# Patient Record
Sex: Female | Born: 1960
Health system: Southern US, Community
[De-identification: ages and names within clinical notes are randomized; demographics above are authoritative.]

## PROBLEM LIST (undated history)

## (undated) DIAGNOSIS — Z9889 Other specified postprocedural states: Secondary | ICD-10-CM

## (undated) DIAGNOSIS — K449 Diaphragmatic hernia without obstruction or gangrene: Secondary | ICD-10-CM

## (undated) DIAGNOSIS — F32A Depression, unspecified: Secondary | ICD-10-CM

## (undated) DIAGNOSIS — R7401 Elevation of levels of liver transaminase levels: Secondary | ICD-10-CM

## (undated) DIAGNOSIS — R112 Nausea with vomiting, unspecified: Secondary | ICD-10-CM

## (undated) DIAGNOSIS — E039 Hypothyroidism, unspecified: Secondary | ICD-10-CM

## (undated) DIAGNOSIS — F329 Major depressive disorder, single episode, unspecified: Secondary | ICD-10-CM

## (undated) HISTORY — DX: Depression, unspecified: F32.A

## (undated) HISTORY — DX: Elevation of levels of liver transaminase levels: R74.01

## (undated) HISTORY — DX: Diaphragmatic hernia without obstruction or gangrene: K44.9

## (undated) HISTORY — DX: Major depressive disorder, single episode, unspecified: F32.9

## (undated) HISTORY — DX: Hypothyroidism, unspecified: E03.9

---

## 1988-08-24 HISTORY — PX: TUBAL LIGATION: SHX77

## 1994-08-24 HISTORY — PX: CHOLECYSTECTOMY, LAPAROSCOPIC: SHX56

## 2004-01-15 ENCOUNTER — Ambulatory Visit (HOSPITAL_COMMUNITY): Admission: RE | Admit: 2004-01-15 | Discharge: 2004-01-15 | Payer: Self-pay | Admitting: Urology

## 2004-07-22 ENCOUNTER — Ambulatory Visit (HOSPITAL_COMMUNITY): Admission: RE | Admit: 2004-07-22 | Discharge: 2004-07-22 | Payer: Self-pay | Admitting: Obstetrics and Gynecology

## 2006-05-07 ENCOUNTER — Ambulatory Visit (HOSPITAL_COMMUNITY): Admission: RE | Admit: 2006-05-07 | Discharge: 2006-05-07 | Payer: Self-pay | Admitting: Urology

## 2008-11-26 ENCOUNTER — Ambulatory Visit (HOSPITAL_COMMUNITY): Admission: RE | Admit: 2008-11-26 | Discharge: 2008-11-26 | Payer: Self-pay | Admitting: Urology

## 2009-06-22 IMAGING — CT CT PELVIS W/O CM
2 of 4 series · 17 of 46 positions shown, 19 images · non-contrast
Comparison: CT 05/07/2006.

CT ABDOMEN

CLINICAL DATA: Hematuria.  Right lower quadrant pain.

CT ABDOMEN AND PELVIS WITHOUT CONTRAST
TECHNIQUE: Multidetector CT imaging of the abdomen and pelvis was
performed following the standard protocol without intravenous
contrast.

[Series 2: stone <45 and or <(id) 5.0 mm · axial · 0.79mm/px · z∈[-496,-51]mm · 14 of 97 slices shown, 16 images]
[im 4/97  soft-tissue]
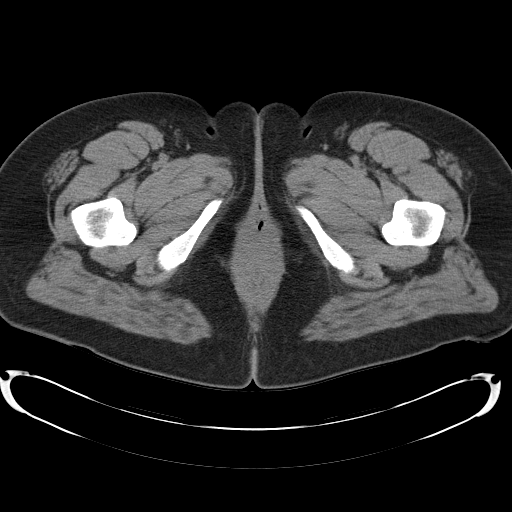
[im 4/97  bone]
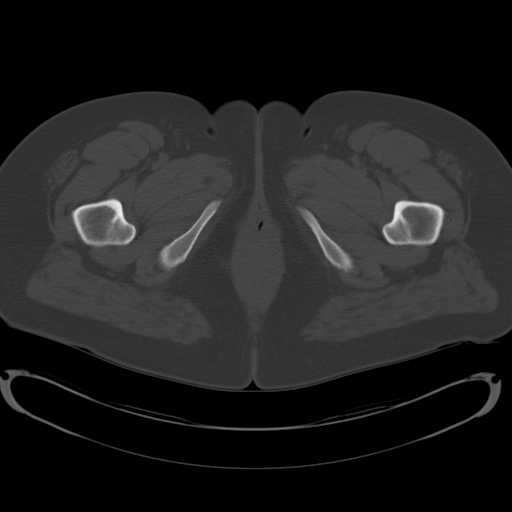
[im 12/97  soft-tissue]
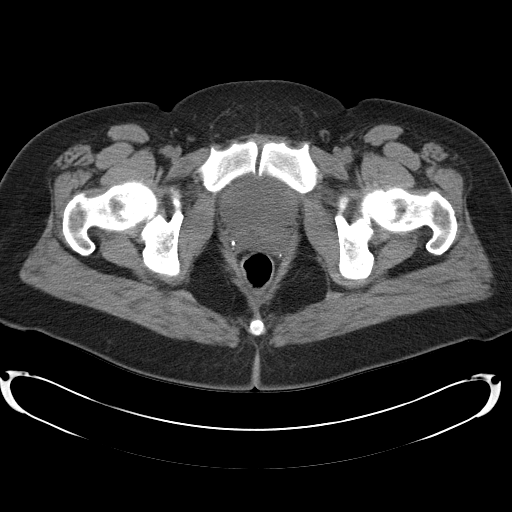
[im 20/97  soft-tissue]
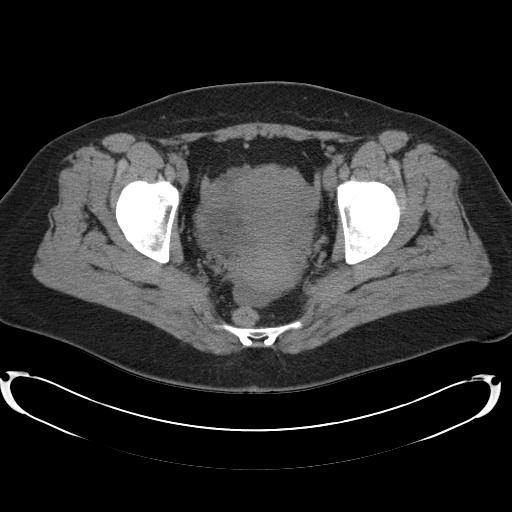
[im 27/97  soft-tissue]
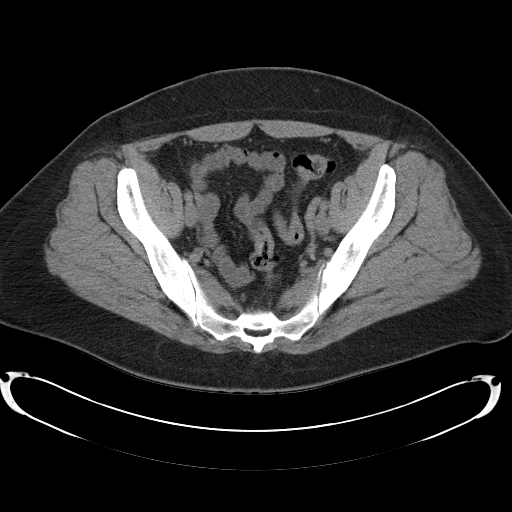
[im 31/97  soft-tissue]
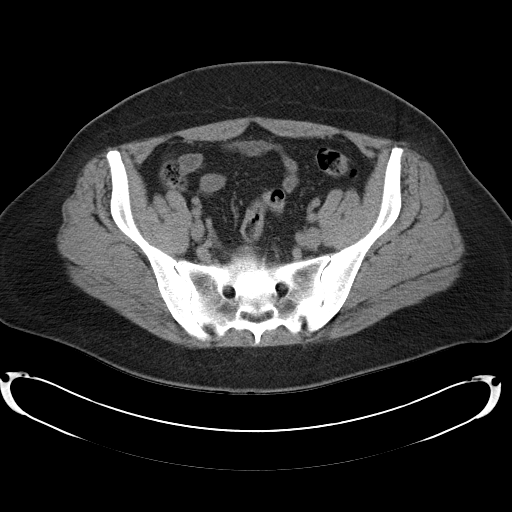
[im 39/97  soft-tissue]
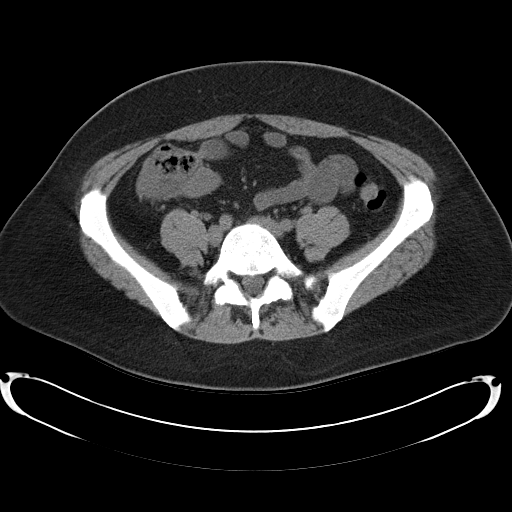
[im 47/97  soft-tissue]
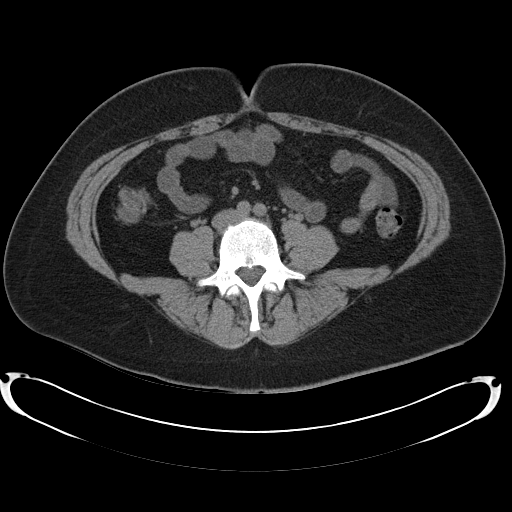
[im 50/97  soft-tissue]
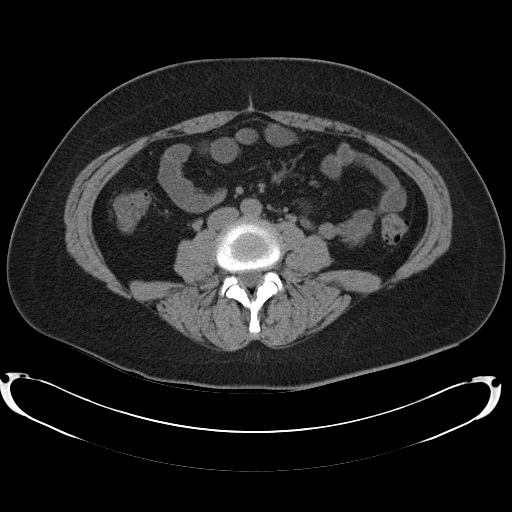
[im 58/97  soft-tissue]
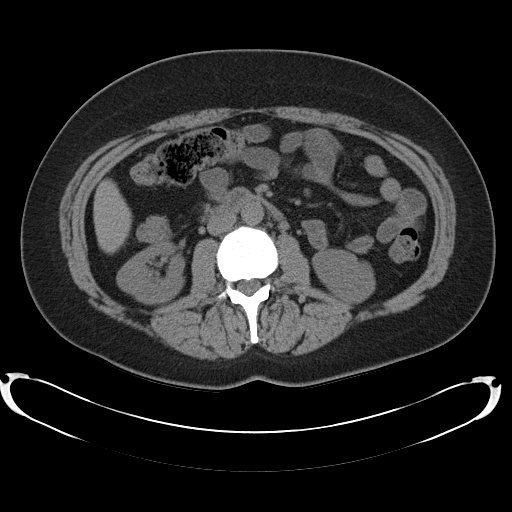
[im 58/97  bone]
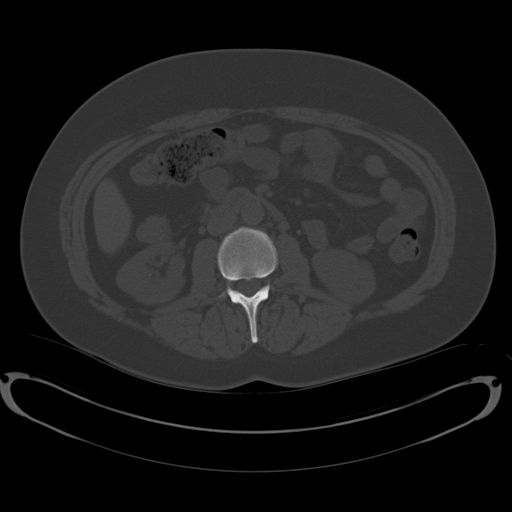
[im 66/97  soft-tissue]
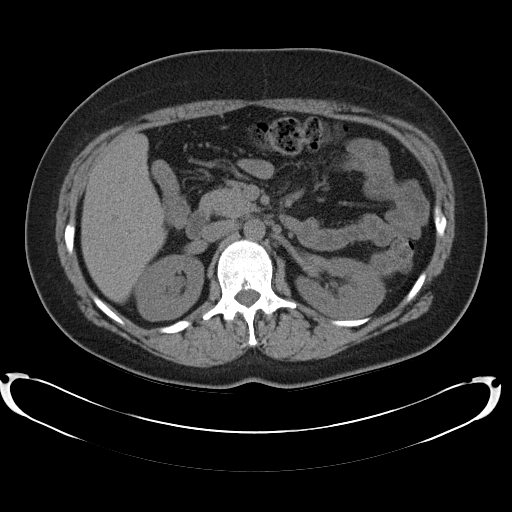
[im 73/97  soft-tissue]
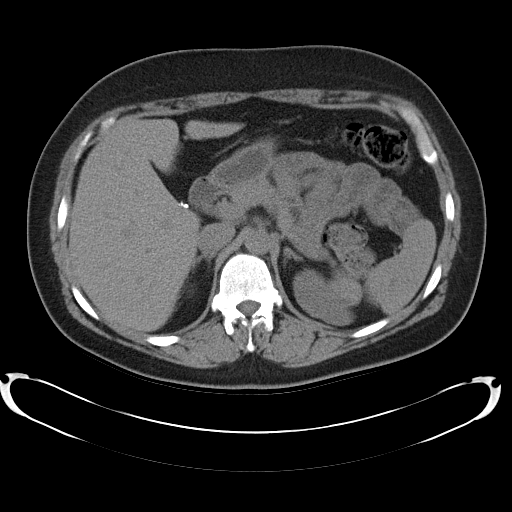
[im 77/97  soft-tissue]
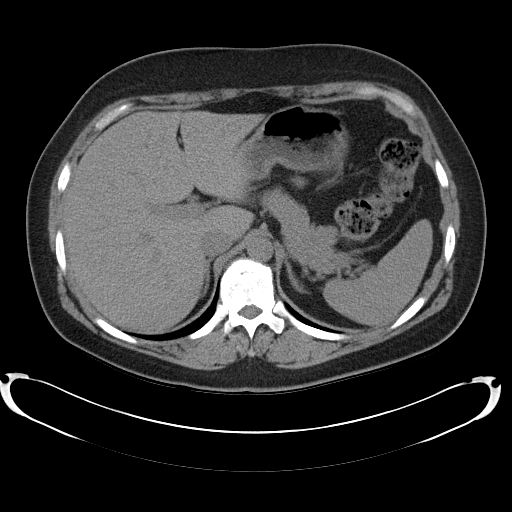
[im 85/97  soft-tissue]
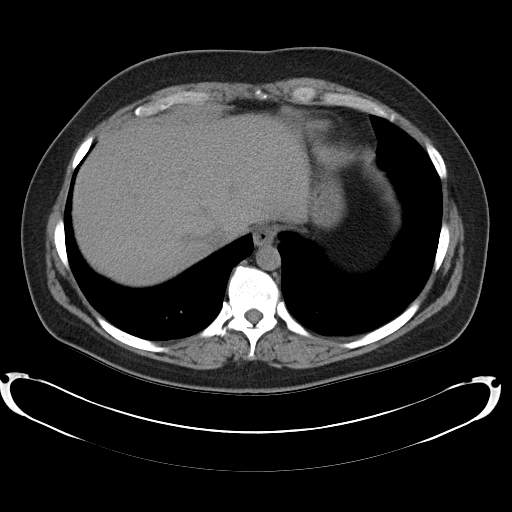
[im 93/97  soft-tissue]
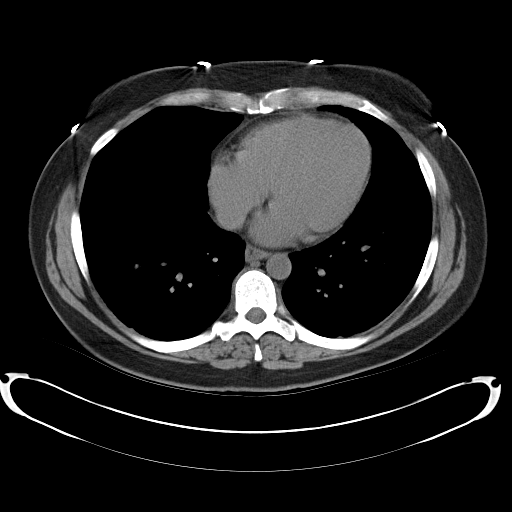

[Series 4: mpr coronal · coronal · 0.76mm/px · 3 of 77 slices shown]
[im 26/77  soft-tissue]
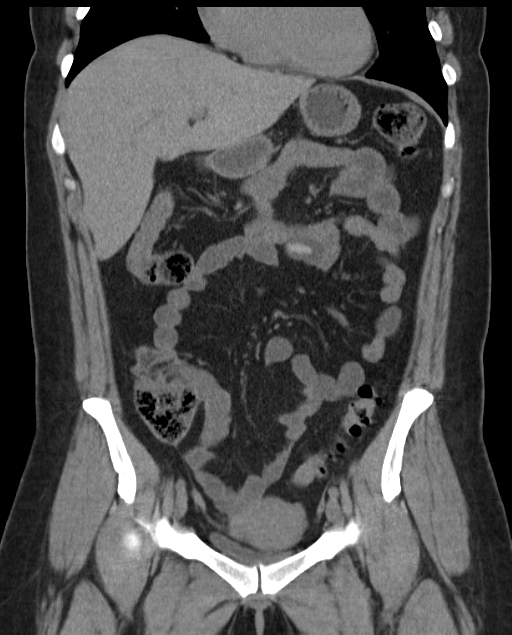
[im 34/77  soft-tissue]
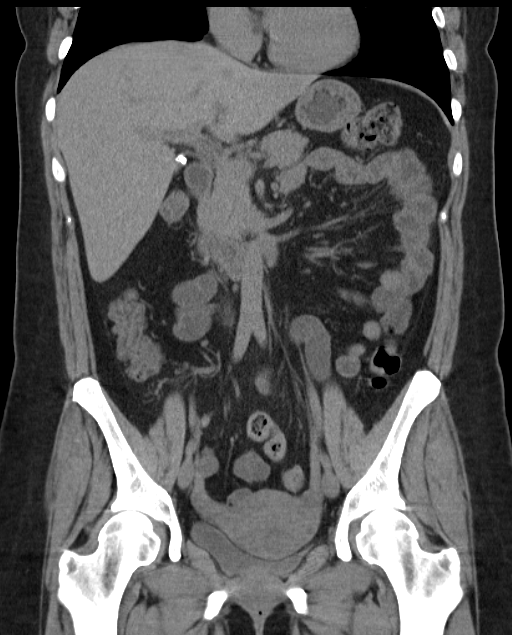
[im 43/77  soft-tissue]
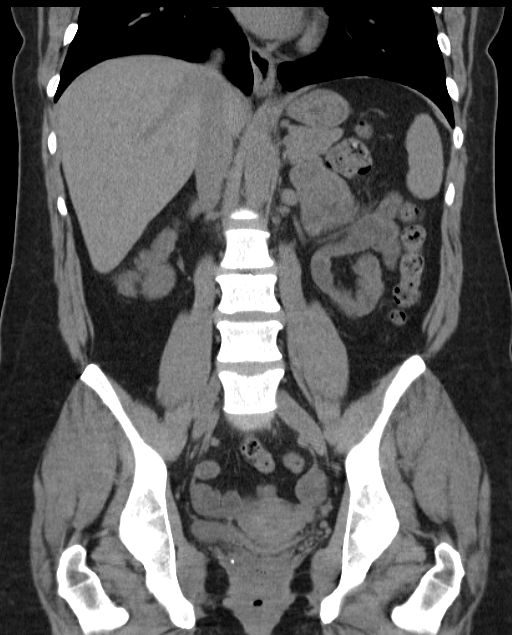

[17 of 46 positions shown; findings below may reference images not displayed]

FINDINGS: Negative for renal calculi.  Mild extrarenal pelvis on
the right is unchanged.  There is no hydronephrosis.  There is no
perinephric stranding.  Small calcification the right adrenal gland
is unchanged.  The adrenal glands are not enlarged.  The
gallbladder has been removed.  The liver spleen pancreas are
normal.  The bowel is normal.
IMPRESSION: No acute abnormality.  Negative for renal calculi.

CT PELVIS
FINDINGS: There are calcifications in the pelvis bilaterally.
These are all most likely phleboliths.  There are two new  small
new calcifications in the right lower pelvis which are felt to be
due to phleboliths as there is no hydronephrosis.  The uterus is
normal and the bowel is normal.  The appendix is normal.
IMPRESSION: No acute abnormality.

## 2010-06-23 ENCOUNTER — Ambulatory Visit (HOSPITAL_COMMUNITY): Admission: RE | Admit: 2010-06-23 | Discharge: 2010-06-23 | Payer: Self-pay | Admitting: Urology

## 2010-09-14 ENCOUNTER — Encounter: Payer: Self-pay | Admitting: Family Medicine

## 2010-09-14 ENCOUNTER — Encounter: Payer: Self-pay | Admitting: Urology

## 2010-09-17 ENCOUNTER — Ambulatory Visit (HOSPITAL_COMMUNITY)
Admission: RE | Admit: 2010-09-17 | Discharge: 2010-09-17 | Payer: Self-pay | Source: Home / Self Care | Attending: Urology | Admitting: Urology

## 2011-01-28 ENCOUNTER — Emergency Department (HOSPITAL_COMMUNITY): Payer: BC Managed Care – PPO

## 2011-01-28 ENCOUNTER — Observation Stay (HOSPITAL_COMMUNITY)
Admission: EM | Admit: 2011-01-28 | Discharge: 2011-01-29 | Disposition: A | Discharge status: Home / Self Care | Payer: BC Managed Care – PPO | Attending: Internal Medicine | Admitting: Internal Medicine

## 2011-01-28 DIAGNOSIS — R0789 Other chest pain: Principal | ICD-10-CM | POA: Insufficient documentation

## 2011-01-28 DIAGNOSIS — E039 Hypothyroidism, unspecified: Secondary | ICD-10-CM | POA: Insufficient documentation

## 2011-01-28 DIAGNOSIS — F411 Generalized anxiety disorder: Secondary | ICD-10-CM | POA: Insufficient documentation

## 2011-01-28 DIAGNOSIS — F3289 Other specified depressive episodes: Secondary | ICD-10-CM | POA: Insufficient documentation

## 2011-01-28 DIAGNOSIS — F329 Major depressive disorder, single episode, unspecified: Secondary | ICD-10-CM | POA: Insufficient documentation

## 2011-01-28 DIAGNOSIS — E663 Overweight: Secondary | ICD-10-CM | POA: Insufficient documentation

## 2011-01-28 LAB — BASIC METABOLIC PANEL
BUN: 20 mg/dL (ref 6–23)
CO2: 25 mEq/L (ref 19–32)
Calcium: 10.2 mg/dL (ref 8.4–10.5)
Chloride: 104 mEq/L (ref 96–112)
Creatinine, Ser: 0.73 mg/dL (ref 0.4–1.2)
GFR calc Af Amer: >60 mL/min (ref >60)
GFR calc non Af Amer: >60 mL/min (ref >60)
Glucose, Bld: 91 mg/dL (ref 70–99)
Potassium: 3.6 mEq/L (ref 3.5–5.1)
Sodium: 138 mEq/L (ref 135–145)

## 2011-01-28 LAB — DIFFERENTIAL
Basophils Absolute: 0 10*3/uL (ref 0.0–0.1)
Basophils Relative: 0 % (ref 0–1)
Eosinophils Absolute: 0.3 10*3/uL (ref 0.0–0.7)
Eosinophils Relative: 3 % (ref 0–5)
Lymphocytes Relative: 34 % (ref 12–46)
Lymphs Abs: 3 10*3/uL (ref 0.7–4.0)
Monocytes Absolute: 0.7 10*3/uL (ref 0.1–1.0)
Monocytes Relative: 8 % (ref 3–12)
Neutro Abs: 4.9 10*3/uL (ref 1.7–7.7)
Neutrophils Relative %: 55 % (ref 43–77)

## 2011-01-28 LAB — D-DIMER, QUANTITATIVE: D-Dimer, Quant: 0.46 ug/mL-FEU (ref 0.00–0.48)

## 2011-01-28 LAB — CK TOTAL AND CKMB (NOT AT ARMC)
CK, MB: 1.6 ng/mL (ref 0.3–4.0)
Relative Index: INVALID (ref 0.0–2.5)
Total CK: 81 U/L (ref 7–177)

## 2011-01-28 LAB — HEPATIC FUNCTION PANEL
ALT: 25 U/L (ref 0–35)
AST: 20 U/L (ref 0–37)
Albumin: 3.9 g/dL (ref 3.5–5.2)
Alkaline Phosphatase: 64 U/L (ref 39–117)
Bilirubin, Direct: 0.1 mg/dL (ref 0.0–0.3)
Indirect Bilirubin: 0.6 mg/dL (ref 0.3–0.9)
Total Bilirubin: 0.7 mg/dL (ref 0.3–1.2)
Total Protein: 7.5 g/dL (ref 6.0–8.3)

## 2011-01-28 LAB — CBC
HCT: 39 % (ref 36.0–46.0)
Hemoglobin: 13.4 g/dL (ref 12.0–15.0)
MCH: 30.9 pg (ref 26.0–34.0)
MCHC: 34.4 g/dL (ref 30.0–36.0)
MCV: 89.9 fL (ref 78.0–100.0)
Platelets: 278 10*3/uL (ref 150–400)
RBC: 4.34 MIL/uL (ref 3.87–5.11)
RDW: 12.8 % (ref 11.5–15.5)
WBC: 8.9 10*3/uL (ref 4.0–10.5)

## 2011-01-28 LAB — LIPASE, BLOOD: Lipase: 39 U/L (ref 11–59)

## 2011-01-28 LAB — TROPONIN I: Troponin I: <0.3 ng/mL (ref <0.30)

## 2011-01-29 LAB — CARDIAC PANEL(CRET KIN+CKTOT+MB+TROPI)
CK, MB: 1.5 ng/mL (ref 0.3–4.0)
CK, MB: 1.3 ng/mL (ref 0.3–4.0)
Relative Index: INVALID (ref 0.0–2.5)
Relative Index: INVALID (ref 0.0–2.5)
Total CK: 67 U/L (ref 7–177)
Total CK: 61 U/L (ref 7–177)
Troponin I: <0.3 ng/mL (ref <0.30)
Troponin I: <0.3 ng/mL (ref <0.30)

## 2011-01-29 LAB — COMPREHENSIVE METABOLIC PANEL
ALT: 22 U/L (ref 0–35)
AST: 19 U/L (ref 0–37)
Albumin: 3.7 g/dL (ref 3.5–5.2)
Alkaline Phosphatase: 60 U/L (ref 39–117)
BUN: 16 mg/dL (ref 6–23)
CO2: 27 mEq/L (ref 19–32)
Calcium: 10 mg/dL (ref 8.4–10.5)
Chloride: 103 mEq/L (ref 96–112)
Creatinine, Ser: 0.82 mg/dL (ref 0.4–1.2)
GFR calc Af Amer: >60 mL/min (ref >60)
GFR calc non Af Amer: >60 mL/min (ref >60)
Glucose, Bld: 86 mg/dL (ref 70–99)
Potassium: 4 mEq/L (ref 3.5–5.1)
Sodium: 138 mEq/L (ref 135–145)
Total Bilirubin: 0.9 mg/dL (ref 0.3–1.2)
Total Protein: 7.2 g/dL (ref 6.0–8.3)

## 2011-01-29 LAB — CBC
HCT: 39.3 % (ref 36.0–46.0)
Hemoglobin: 13.1 g/dL (ref 12.0–15.0)
MCH: 30.3 pg (ref 26.0–34.0)
MCHC: 33.3 g/dL (ref 30.0–36.0)
MCV: 91 fL (ref 78.0–100.0)
Platelets: 272 10*3/uL (ref 150–400)
RBC: 4.32 MIL/uL (ref 3.87–5.11)
RDW: 13 % (ref 11.5–15.5)
WBC: 9.6 10*3/uL (ref 4.0–10.5)

## 2011-01-29 LAB — LIPID PANEL
Cholesterol: 173 mg/dL (ref 0–200)
HDL: 40 mg/dL (ref >39)
LDL Cholesterol: 100 mg/dL — ABNORMAL HIGH (ref 0–99)
Total CHOL/HDL Ratio: 4.3 RATIO
Triglycerides: 163 mg/dL — ABNORMAL HIGH (ref <150)
VLDL: 33 mg/dL (ref 0–40)

## 2011-01-29 LAB — DIFFERENTIAL
Basophils Absolute: 0 10*3/uL (ref 0.0–0.1)
Basophils Relative: 0 % (ref 0–1)
Eosinophils Absolute: 0.3 10*3/uL (ref 0.0–0.7)
Eosinophils Relative: 3 % (ref 0–5)
Lymphocytes Relative: 44 % (ref 12–46)
Lymphs Abs: 4.2 10*3/uL — ABNORMAL HIGH (ref 0.7–4.0)
Monocytes Absolute: 0.6 10*3/uL (ref 0.1–1.0)
Monocytes Relative: 6 % (ref 3–12)
Neutro Abs: 4.5 10*3/uL (ref 1.7–7.7)
Neutrophils Relative %: 47 % (ref 43–77)

## 2011-01-29 NOTE — H&P (Signed)
Tanya Fuentes, PEEKS NO.:  [id number]  MEDICAL RECORD NO.:  [id number]  LOCATION:  [facility]                          FACILITY:  [facility]  PHYSICIAN:  Osvaldo Shipper, MD     DATE OF BIRTH:  [birth year]  DATE OF ADMISSION:  01/28/2011 DATE OF DISCHARGE:  [facility]                             HISTORY & PHYSICAL   PRIMARY CARE PHYSICIAN:  [name], [place].  ADMISSION DIAGNOSES: 1. Chest pain, rule out acute coronary syndrome. 2. Overweight. 3. History of depression. 4. History of hypothyroidism.  CHIEF COMPLAINT:  Chest pain since this afternoon.  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  She was at work today and she had her lunch which consisted of hot dog.  This was followed by onset of left-sided chest pain which was a tightness, 8/10 in intensity. It was pretty continuous, radiated to her neck, her jaw, radiating to the left arm which became numb.  She had no shortness of breath, had some palpitations.  She checked her blood pressure which was 96/68. After a few minutes, it was 100/60.  Denies any cough, fever, or chills recently.  Denies any neck pain.  No increase in the pain with movement of the neck.  Denies any dizziness, lightheadedness.  The pain currently is 3/10 in intensity.  She has never had similar pains in the past.  She denies any change in her habits recently, however, had been more stressed because of problems that she has been having with first son who just returned from [place] and has been diagnosed with posttraumatic stress disorder.  She also mentioned over the last few days, she has had some burping and bloating sensation in her stomach after eating food.  Denies any pain per se.  MEDICATIONS AT HOME: 1. Celexa 20 mg daily. 2. Synthroid 75 mcg daily. 3. Had some Advil  over-the-counter occasionally.  ALLERGIES:  COMPAZINE which causes agitation and PENICILLIN which causes a rash.  PAST MEDICAL HISTORY:  Anxiety disorder, depression, hypothyroidism.  SURGICAL HISTORY:  Cholecystectomy in the 1990s, tubal ligation.  SOCIAL HISTORY:  Lives in [place] with her husband.  She works as a Engineer, civil (consulting) in a Retail buyer.  Denies smoking, occasional alcohol use in the form of wine.  Denies any illicit drug use.  She tries to walk for exercise but does not do any other exercises.  FAMILY HISTORY:  Father died of stroke and diabetes.  Mother also has passed away.  She has Alzheimer's.  She is youngest of the sibling.  She has 2 older brothers, 2 sisters.  One of her brother has been diagnosed with angina but no real heart disease.  There is also history of diabetes in the sibling.  REVIEW OF SYSTEMS:  GENERAL:  Positive for weakness.  HEENT: Unremarkable.  CARDIOVASCULAR:  As in HPI.  RESPIRATORY:  As in HPI. GI:  As in HPI.  GU:  Unremarkable.  NEUROLOGIC:  Unremarkable. PSYCHIATRIC:  Unremarkable.  DERMATOLOGICAL:  Unremarkable.  Other systems reviewed and found to be  negative.  PHYSICAL EXAMINATION:  VITAL SIGNS:  Temperature 98.0, blood pressure 120/66, heart rate 70, respiratory rate 20, saturation 98% on room air. GENERAL:  This is an overweight white female in no distress.  Seems to be anxious. HEENT:  Head is normocephalic, atraumatic.  Pupils are equal and reacting.  No pallor, no icterus.  Oral mucous membranes moist.  No oral lesions are noted. NECK:  Soft and supple.  No thyromegaly is appreciated. LUNGS:  Clear to auscultation bilaterally.  No wheezing, rales, or rhonchi. CARDIOVASCULAR:  S1, S2 is normal, regular.  No S3, S4.  No rubs, murmurs, or bruits.  No pedal edema.  No tenderness to palpation over the left chest wall.  Peripheral pulses are palpable. ABDOMEN:  Soft, nontender, nondistended.  Bowel sounds are present.   No masses or organomegaly is appreciated. GU:  Deferred. MUSCULOSKELETAL:  Normal muscle mass and tone. NEUROLOGICAL:  She is alert, oriented x3.  No focal neurological deficits are present. SKIN:  Does not reveal any rashes.  LABORATORY DATA:  Her CBC, D-dimer, BMET, cardiac enzymes are all negative/normal.  EKG shows sinus rhythm at 72 with normal axis. Intervals appear to be in the normal range.  No Q-waves.  No concerning ST changes.  There is some nonspecific T-wave in the form of inversion in lead III as well as in V3.  Older EKGs are not available on this patient.  Chest x-ray was done which showed no active disease.  ASSESSMENT:  This is a 50 year old Caucasian female who has been having a lot of stressors in her life recently who has been having some GI symptomatology as well presents with chest pain.  Some of the features of the pain were concerning for angina.  However, at this point the pain has become atypical.  Differential diagnosis still includes angina in a woman with atypical features.  This could be esophageal spasm, especially considering her recent GI symptomatology.  Unlikely to be thromboembolic.  PLAN: Chest pain.  We will rule her out for acute coronary syndrome with serial cardiac enzymes.  It should be noted that the pain did get better when she was given the nitroglycerin in the emergency department.  So esophageal spasm is a good possibility.  However, obviously angina is also a possibility.  We will also check her LFTs and lipase as well as repeat EKG in the morning.  Check a lipid panel.  We will also put her on Protonix for now.  I have explained to the patient that if her pain resolves and her enzymes come back negative, EKG remains unchanged that she should be able to go home soon with followup with her primary care physician.  We will continue with her Celexa and her Synthroid for her depression and hypothyroidism respectively.  We will give her  low-dose Xanax as needed for anxiety.  DVT prophylaxis will be initiated.  Further management decisions will depend on results of further testing and patient's response to treatment.  Osvaldo Shipper, MD     GK/MEDQ  D:  01/28/2011  T:  01/28/2011  Job:  [id number]  cc:   [name], MD Fax: [phone]  Electronically Signed by Osvaldo Shipper MD on 01/29/2011 11:32:53 PM

## 2011-02-03 NOTE — Discharge Summary (Signed)
  NAMERHINA, KRAMME              ACCOUNT NO.:  [id number]  MEDICAL RECORD NO.:  [id number]  LOCATION:  A311                          FACILITY:  [facility]  PHYSICIAN:  Kaitlynn Tramontana L. Lendell Caprice, MDDATE OF BIRTH:  01/24/1961  DATE OF ADMISSION:  01/28/2011 DATE OF DISCHARGE:  06/07/2012LH                              DISCHARGE SUMMARY   DISCHARGE DIAGNOSES: 1. Chest pain, suspect gastrointestinal etiology. 2. Hypothyroidism. 3. Depression. 4. Overweight.  DISCHARGE MEDICATIONS: 1. Celexa 20 mg a day. 2. Synthroid 75 mcg a day. 3. Prilosec 40 mg a day.  CONDITION:  Stable.  ACTIVITY:  Ad lib.  Follow up with [facility].  Consider outpatient stress test to rule out coronary artery disease.  DIET:  Regular.  CONDITION:  Stable.  CONSULTATIONS:  None.  PROCEDURES:  None.  LABORATORY DATA:  D-dimer normal.  Serial cardiac enzymes normal.  CBC normal.  Complete metabolic panel normal.  Fasting lipid profile pending.  Lipase normal.  DIAGNOSTICS AND RADIOLOGY:  Chest x-ray shows nothing acute.  EKG shows normal sinus rhythm.  HISTORY AND HOSPITAL COURSE:  Please see H and P for details.  Ms. Schuenemann is a 50 year old white female who had left and substernal chest pain radiating to her left neck after eating a hot dog.  She reports some times food getting stuck in her esophagus for a minute occasionally.  She denies reflux symptoms.  Apparently, the discomfort was relieved by nitroglycerin.  She reports having had a stress test many years ago.  She apparently has been feeling bloated and belching. She takes Advil occasionally.  She was admitted to telemetry.  She ruled out for MI and PE.  Her pain was improved and never returned.  She is requesting discharge.  I will give her a prescription for Prilosec as this may be esophageal reflux and spasm, but it would be worthwhile to rule out coronary artery disease with a stress test.  She is relatively low risk and  this can be done as an outpatient which is what she prefers.  She prefers to see [facility], and I will ask the secretary to schedule an appointment.     Evrett Hakim L. Lendell Caprice, MD     CLS/MEDQ  D:  01/29/2011  T:  01/30/2011  Job:  [id number]  cc:   [name], MD Fax: [phone]  [facility].  Electronically Signed by Crista Curb MD on 02/03/2011 11:33:58 AM

## 2015-10-17 ENCOUNTER — Ambulatory Visit: Payer: Self-pay | Admitting: Adult Health

## 2015-11-25 DIAGNOSIS — R1013 Epigastric pain: Secondary | ICD-10-CM | POA: Diagnosis not present

## 2015-11-25 DIAGNOSIS — Z833 Family history of diabetes mellitus: Secondary | ICD-10-CM | POA: Diagnosis not present

## 2015-11-25 DIAGNOSIS — R1310 Dysphagia, unspecified: Secondary | ICD-10-CM | POA: Diagnosis not present

## 2015-11-25 DIAGNOSIS — K219 Gastro-esophageal reflux disease without esophagitis: Secondary | ICD-10-CM | POA: Diagnosis not present

## 2015-11-25 DIAGNOSIS — Z9049 Acquired absence of other specified parts of digestive tract: Secondary | ICD-10-CM | POA: Diagnosis not present

## 2015-11-25 DIAGNOSIS — Z8489 Family history of other specified conditions: Secondary | ICD-10-CM | POA: Diagnosis not present

## 2015-11-25 DIAGNOSIS — F329 Major depressive disorder, single episode, unspecified: Secondary | ICD-10-CM | POA: Diagnosis not present

## 2015-11-25 DIAGNOSIS — R1314 Dysphagia, pharyngoesophageal phase: Secondary | ICD-10-CM | POA: Diagnosis not present

## 2015-11-25 DIAGNOSIS — K222 Esophageal obstruction: Secondary | ICD-10-CM | POA: Diagnosis not present

## 2015-11-25 DIAGNOSIS — E039 Hypothyroidism, unspecified: Secondary | ICD-10-CM | POA: Diagnosis not present

## 2015-11-25 DIAGNOSIS — Z880 Allergy status to penicillin: Secondary | ICD-10-CM | POA: Diagnosis not present

## 2015-11-25 DIAGNOSIS — Z888 Allergy status to other drugs, medicaments and biological substances status: Secondary | ICD-10-CM | POA: Diagnosis not present

## 2015-11-25 DIAGNOSIS — Z79899 Other long term (current) drug therapy: Secondary | ICD-10-CM | POA: Diagnosis not present

## 2015-12-11 DIAGNOSIS — Z6835 Body mass index (BMI) 35.0-35.9, adult: Secondary | ICD-10-CM | POA: Diagnosis not present

## 2015-12-11 DIAGNOSIS — R1013 Epigastric pain: Secondary | ICD-10-CM | POA: Diagnosis not present

## 2015-12-12 ENCOUNTER — Ambulatory Visit: Payer: Self-pay | Admitting: Adult Health

## 2015-12-31 DIAGNOSIS — J069 Acute upper respiratory infection, unspecified: Secondary | ICD-10-CM | POA: Diagnosis not present

## 2016-01-09 DIAGNOSIS — R072 Precordial pain: Secondary | ICD-10-CM | POA: Diagnosis not present

## 2016-01-09 DIAGNOSIS — Z6835 Body mass index (BMI) 35.0-35.9, adult: Secondary | ICD-10-CM | POA: Diagnosis not present

## 2016-01-09 DIAGNOSIS — R0602 Shortness of breath: Secondary | ICD-10-CM | POA: Diagnosis not present

## 2016-01-16 ENCOUNTER — Encounter: Payer: Self-pay | Admitting: *Deleted

## 2016-01-17 ENCOUNTER — Encounter: Payer: Self-pay | Admitting: Cardiovascular Disease

## 2016-01-17 ENCOUNTER — Ambulatory Visit (INDEPENDENT_AMBULATORY_CARE_PROVIDER_SITE_OTHER): Payer: BLUE CROSS/BLUE SHIELD | Admitting: Cardiovascular Disease

## 2016-01-17 ENCOUNTER — Encounter: Payer: Self-pay | Admitting: *Deleted

## 2016-01-17 VITALS — BP 148/79 | HR 76 | Ht 67.0 in | Wt 229.0 lb

## 2016-01-17 DIAGNOSIS — R519 Headache, unspecified: Secondary | ICD-10-CM

## 2016-01-17 DIAGNOSIS — R5383 Other fatigue: Secondary | ICD-10-CM

## 2016-01-17 DIAGNOSIS — R51 Headache: Secondary | ICD-10-CM

## 2016-01-17 DIAGNOSIS — IMO0001 Reserved for inherently not codable concepts without codable children: Secondary | ICD-10-CM

## 2016-01-17 DIAGNOSIS — R0683 Snoring: Secondary | ICD-10-CM

## 2016-01-17 DIAGNOSIS — R079 Chest pain, unspecified: Secondary | ICD-10-CM

## 2016-01-17 DIAGNOSIS — R030 Elevated blood-pressure reading, without diagnosis of hypertension: Secondary | ICD-10-CM

## 2016-01-17 NOTE — Progress Notes (Signed)
Patient ID: Tanya Fuentes, female   DOB: [birth year], 55 y.o.   MRN: [id number]       CARDIOLOGY CONSULT NOTE  Patient ID: Tanya BelliniKathryn G Tedder MRN: [id number] DOB/AGE: [birth year] 55 y.o.  Admit date: (Not on file) Primary Physician: Estanislado PandySASSER,PAUL W, MD Referring Physician: Neita CarpSasser MD  Reason for Consultation: chest pain, fatigue  HPI: The patient is a 55 year old woman with a history of hypothyroidism, GERD, and obesity who is referred for the evaluation of chest pain and fatigue.  ECG performed at PCPs office earlier this month which I personally interpreted demonstrates sinus rhythm with no ischemic ST segment or T-wave abnormalities, nor any arrhythmias. It was a poor quality tracing.  She has been feeling much more fatigued since Christmas. One year ago she felt like she was able to do a lot of activities which she is currently unable to do. She takes care of 2 grandchildren and is worn out by the end of the day. She experiences left precordial chest heaviness radiating into her jaw and left ear with left arm numbness. This can occur both with and without exertion. She has had a 20 pound weight gain since menopause in the past year. She said her blood pressure is normally 120/80 or lower. She had a normal CBC and basic metabolic panel which I personally reviewed [facility]. Blood pressure today 148/79.   Allergies  Allergen Reactions  . Compazine [Prochlorperazine Edisylate] Other (See Comments)    hyper  . Penicillins Rash    Current Outpatient Prescriptions  Medication Sig Dispense Refill  . citalopram (CELEXA) 20 MG tablet Take 20 mg by mouth daily.    Marland Kitchen. Conj Estrogens-Bazedoxifene (DUAVEE) 0.45-20 MG TABS Take 1 tablet by mouth daily.    Marland Kitchen. levothyroxine (SYNTHROID, LEVOTHROID) 100 MCG tablet Take 1 tablet by mouth daily.  0  . nitroGLYCERIN (NITROSTAT) 0.4 MG SL tablet Place 0.4 mg under the tongue every 5 (five) minutes x 3 doses as needed for  chest pain.    . pantoprazole (PROTONIX) 40 MG tablet Take 40 mg by mouth daily.     No current facility-administered medications for this visit.    Past Medical History  Diagnosis Date  . Depression   . Hypothyroidism   . Hiatal hernia     Past Surgical History  Procedure Laterality Date  . Cholecystectomy, laparoscopic  1996  . Tubal ligation  1990    Social History   Social History  . Marital Status: Married    Spouse Name: N/A  . Number of Children: N/A  . Years of Education: N/A   Occupational History  . Not on file.   Social History Main Topics  . Smoking status: Never Smoker   . Smokeless tobacco: Never Used  . Alcohol Use: Not on file  . Drug Use: Not on file  . Sexual Activity: Not on file   Other Topics Concern  . Not on file   Social History Narrative     No family history of premature CAD in 1st degree relatives.  Prior to Admission medications   Medication Sig Start Date End Date Taking? Authorizing Provider  citalopram (CELEXA) 20 MG tablet Take 20 mg by mouth daily.   Yes Historical Provider, MD  Conj Estrogens-Bazedoxifene (DUAVEE) 0.45-20 MG TABS Take 1 tablet by mouth daily.   Yes Historical Provider, MD  levothyroxine (SYNTHROID, LEVOTHROID) 100 MCG tablet Take 1 tablet by mouth daily. 12/13/15  Yes Historical Provider, MD  nitroGLYCERIN (NITROSTAT) 0.4 MG SL tablet Place 0.4 mg under the tongue every 5 (five) minutes x 3 doses as needed for chest pain.   Yes Historical Provider, MD  pantoprazole (PROTONIX) 40 MG tablet Take 40 mg by mouth daily.   Yes Historical Provider, MD     Review of systems complete and found to be negative unless listed above in HPI     Physical exam Blood pressure 148/79, pulse 76, height  (1.702 m), weight 229 lb (103.874 kg). General: NAD Neck: No JVD, no thyromegaly or thyroid nodule.  Lungs: Clear to auscultation bilaterally with normal respiratory effort. CV: Nondisplaced PMI. Regular rate and  rhythm, normal S1/S2, no S3/S4, no murmur.  No peripheral edema.  No carotid bruit.  Normal pedal pulses.  Abdomen: Soft, nontender, obese, no distention.  Skin: Intact without lesions or rashes.  Neurologic: Alert and oriented x 3.  Psych: Normal affect. Extremities: No clubbing or cyanosis.  HEENT: Normal.   ECG: Most recent ECG reviewed.  Labs:   Lab Results  Component Value Date   WBC 9.6 01/29/2011   HGB 13.1 01/29/2011   HCT 39.3 01/29/2011   MCV 91.0 01/29/2011   PLT 272 01/29/2011   No results for input(s): NA, K, CL, CO2, BUN, CREATININE, CALCIUM, PROT, BILITOT, ALKPHOS, ALT, AST, GLUCOSE in the last 168 hours.  Invalid input(s): LABALBU Lab Results  Component Value Date   CKTOTAL 61 01/29/2011   CKMB 1.3 01/29/2011   TROPONINI  01/29/2011    <0.30        Due to the release kinetics of cTnI, a negative result within the first hours of the onset of symptoms does not rule out myocardial infarction with certainty. If myocardial infarction is still suspected, repeat the test at appropriate intervals.    Lab Results  Component Value Date   CHOL 173 01/29/2011   Lab Results  Component Value Date   HDL 40 01/29/2011   Lab Results  Component Value Date   LDLCALC * 01/29/2011    100        Total Cholesterol/HDL:CHD Risk Coronary Heart Disease Risk Table                     Men   Women  1/2 Average Risk   3.4   3.3  Average Risk       5.0   4.4  2 X Average Risk   9.6   7.1  3 X Average Risk  23.4   11.0        Use the calculated Patient Ratio above and the CHD Risk Table to determine the patient's CHD Risk.        ATP III CLASSIFICATION (LDL):  <100     mg/dL   Optimal  119-147  mg/dL   Near or Above                    Optimal  130-159  mg/dL   Borderline  829-562  mg/dL   High  >130     mg/dL   Very High   Lab Results  Component Value Date   TRIG 163* 01/29/2011   Lab Results  Component Value Date   CHOLHDL 4.3 01/29/2011   No results found  for: LDLDIRECT       Studies: No results found.  ASSESSMENT AND PLAN:  1. Chest pain: I will proceed with a nuclear myocardial perfusion imaging study (Lexiscan) to evaluate for ischemic  heart disease. I will order a 2-D echocardiogram with Doppler to evaluate cardiac structure, function, and regional wall motion.  2. Fatigue with snoring and morning headaches: Will obtain a sleep study.  3. Elevated BP: Normally controlled per pt. Will monitor.  Dispo: fu 6-8 weeks.    Signed: Prentice Docker, M.D., F.A.C.C.  01/17/2016, 9:53 AM

## 2016-01-17 NOTE — Patient Instructions (Signed)
Your physician has requested that you have an echocardiogram. Echocardiography is a painless test that uses sound waves to create images of your heart. It provides your doctor with information about the size and shape of your heart and how well your heart's chambers and valves are working. This procedure takes approximately one hour. There are no restrictions for this procedure. Your physician has requested that you have a lexiscan myoview. For further information please visit [email or web address]. Please follow instruction sheet, as given. Your physician has recommended that you have a sleep study. This test records several body functions during sleep, including: brain activity, eye movement, oxygen and carbon dioxide blood levels, heart rate and rhythm, breathing rate and rhythm, the flow of air through your mouth and nose, snoring, body muscle movements, and chest and belly movement. Office will contact with results via phone or letter.   Continue all current medications. Follow up in  6-8 weeks.

## 2016-01-23 ENCOUNTER — Encounter (HOSPITAL_COMMUNITY)
Admission: RE | Admit: 2016-01-23 | Discharge: 2016-01-23 | Disposition: A | Discharge status: Home / Self Care | Payer: BLUE CROSS/BLUE SHIELD | Source: Ambulatory Visit | Attending: Cardiovascular Disease | Admitting: Cardiovascular Disease

## 2016-01-23 ENCOUNTER — Ambulatory Visit (HOSPITAL_COMMUNITY)
Admission: RE | Admit: 2016-01-23 | Discharge: 2016-01-23 | Disposition: A | Discharge status: Home / Self Care | Payer: BLUE CROSS/BLUE SHIELD | Source: Ambulatory Visit | Attending: Cardiovascular Disease | Admitting: Cardiovascular Disease

## 2016-01-23 ENCOUNTER — Telehealth: Payer: Self-pay | Admitting: *Deleted

## 2016-01-23 ENCOUNTER — Inpatient Hospital Stay (HOSPITAL_COMMUNITY): Admission: RE | Admit: 2016-01-23 | Payer: BLUE CROSS/BLUE SHIELD | Source: Ambulatory Visit

## 2016-01-23 ENCOUNTER — Encounter (HOSPITAL_COMMUNITY): Payer: Self-pay

## 2016-01-23 DIAGNOSIS — R5383 Other fatigue: Secondary | ICD-10-CM

## 2016-01-23 DIAGNOSIS — I071 Rheumatic tricuspid insufficiency: Secondary | ICD-10-CM | POA: Insufficient documentation

## 2016-01-23 DIAGNOSIS — R079 Chest pain, unspecified: Secondary | ICD-10-CM | POA: Insufficient documentation

## 2016-01-23 MED ORDER — TECHNETIUM TC 99M TETROFOSMIN IV KIT
10.0000 | PACK | Freq: Once | INTRAVENOUS | Status: AC | PRN
  Administered 2016-01-23: 10 via INTRAVENOUS

## 2016-01-23 MED ORDER — TECHNETIUM TC 99M TETROFOSMIN IV KIT
30.0000 | PACK | Freq: Once | INTRAVENOUS | Status: AC | PRN
  Administered 2016-01-23: 30 via INTRAVENOUS

## 2016-01-23 MED ORDER — REGADENOSON 0.4 MG/5ML IV SOLN
INTRAVENOUS | Status: AC
  Administered 2016-01-23: 0.4 mg via INTRAVENOUS
  Filled 2016-01-23: qty 5

## 2016-01-23 MED ORDER — SODIUM CHLORIDE 0.9% FLUSH
INTRAVENOUS | Status: AC
  Administered 2016-01-23: 10 mL via INTRAVENOUS
  Filled 2016-01-23: qty 10

## 2016-01-23 NOTE — Telephone Encounter (Signed)
Called patient with test results. No answer. Left message to call back.  

## 2016-01-23 NOTE — Telephone Encounter (Signed)
-----   Message from Laqueta LindenSuresh A Koneswaran, MD sent at 01/23/2016  3:48 PM EDT ----- Normal cardiac function.

## 2016-01-23 NOTE — Progress Notes (Signed)
*  PRELIMINARY RESULTS* Echocardiogram 2D Echocardiogram has been performed.  Jeryl Columbialliott, Sloane Junkin 01/23/2016, 12:56 PM

## 2016-01-24 LAB — NM MYOCAR MULTI W/SPECT W/WALL MOTION / EF
CSEPPHR: 106 {beats}/min
LHR: 0.39
LV dias vol: 76 mL (ref 46–106)
LVSYSVOL: 32 mL
NUC STRESS TID: 1.19
Rest HR: 63 {beats}/min
SDS: 0
SRS: 0
SSS: 0

## 2016-01-24 NOTE — Progress Notes (Signed)
Quick Note:  Patient notified. Copy to PCP ______

## 2016-01-27 ENCOUNTER — Telehealth: Payer: Self-pay | Admitting: Cardiovascular Disease

## 2016-01-27 NOTE — Telephone Encounter (Signed)
Patient called requesting test results of her stress test.

## 2016-01-27 NOTE — Telephone Encounter (Signed)
-----   Message from Nori Riisatherine A Carlton, RN sent at 01/24/2016  1:30 PM EDT -----   ----- Message -----    From: Laqueta LindenSuresh A Koneswaran, MD    Sent: 01/24/2016   1:09 PM      To: Nori Riisatherine A Carlton, RN  Low risk.

## 2016-01-27 NOTE — Telephone Encounter (Signed)
Patient informed. 

## 2016-02-23 DIAGNOSIS — J0100 Acute maxillary sinusitis, unspecified: Secondary | ICD-10-CM | POA: Diagnosis not present

## 2016-03-06 ENCOUNTER — Telehealth: Payer: Self-pay | Admitting: *Deleted

## 2016-03-06 NOTE — Telephone Encounter (Signed)
Patient has follow up scheduled for 03/13/2016.  She did her stress test & echo, but "no showed" for her sleep study.    Left message to return call.

## 2016-03-09 ENCOUNTER — Ambulatory Visit: Payer: BLUE CROSS/BLUE SHIELD | Admitting: Cardiovascular Disease

## 2016-03-10 NOTE — Telephone Encounter (Signed)
Patient cancelled follow up previously scheduled for 03/13/16 on 03/09/2016.  (PATIENT WANTED CANCELLED DUE TO ALL HER TEST NOT SHOWING ANYTHING)

## 2016-03-12 DIAGNOSIS — L049 Acute lymphadenitis, unspecified: Secondary | ICD-10-CM | POA: Diagnosis not present

## 2016-03-12 DIAGNOSIS — Z6835 Body mass index (BMI) 35.0-35.9, adult: Secondary | ICD-10-CM | POA: Diagnosis not present

## 2016-03-13 ENCOUNTER — Ambulatory Visit: Payer: BLUE CROSS/BLUE SHIELD | Admitting: Cardiovascular Disease

## 2016-05-18 DIAGNOSIS — Z6835 Body mass index (BMI) 35.0-35.9, adult: Secondary | ICD-10-CM | POA: Diagnosis not present

## 2016-05-18 DIAGNOSIS — R87811 Vaginal high risk human papillomavirus (HPV) DNA test positive: Secondary | ICD-10-CM | POA: Diagnosis not present

## 2016-05-18 DIAGNOSIS — R87620 Atypical squamous cells of undetermined significance on cytologic smear of vagina (ASC-US): Secondary | ICD-10-CM | POA: Diagnosis not present

## 2016-05-22 DIAGNOSIS — Z23 Encounter for immunization: Secondary | ICD-10-CM | POA: Diagnosis not present

## 2016-06-09 DIAGNOSIS — R1031 Right lower quadrant pain: Secondary | ICD-10-CM | POA: Diagnosis not present

## 2016-06-09 DIAGNOSIS — R5383 Other fatigue: Secondary | ICD-10-CM | POA: Diagnosis not present

## 2016-06-09 DIAGNOSIS — E039 Hypothyroidism, unspecified: Secondary | ICD-10-CM | POA: Diagnosis not present

## 2016-06-09 DIAGNOSIS — Z6835 Body mass index (BMI) 35.0-35.9, adult: Secondary | ICD-10-CM | POA: Diagnosis not present

## 2016-06-12 DIAGNOSIS — R109 Unspecified abdominal pain: Secondary | ICD-10-CM | POA: Diagnosis not present

## 2016-06-12 DIAGNOSIS — R102 Pelvic and perineal pain: Secondary | ICD-10-CM | POA: Diagnosis not present

## 2016-07-20 DIAGNOSIS — M25579 Pain in unspecified ankle and joints of unspecified foot: Secondary | ICD-10-CM | POA: Diagnosis not present

## 2016-07-20 DIAGNOSIS — M722 Plantar fascial fibromatosis: Secondary | ICD-10-CM | POA: Diagnosis not present

## 2016-07-20 DIAGNOSIS — M79672 Pain in left foot: Secondary | ICD-10-CM | POA: Diagnosis not present

## 2016-09-19 DIAGNOSIS — N3001 Acute cystitis with hematuria: Secondary | ICD-10-CM | POA: Diagnosis not present

## 2016-10-22 DIAGNOSIS — H40053 Ocular hypertension, bilateral: Secondary | ICD-10-CM | POA: Diagnosis not present

## 2016-11-06 DIAGNOSIS — Z6835 Body mass index (BMI) 35.0-35.9, adult: Secondary | ICD-10-CM | POA: Diagnosis not present

## 2016-11-06 DIAGNOSIS — N951 Menopausal and female climacteric states: Secondary | ICD-10-CM | POA: Diagnosis not present

## 2016-11-06 DIAGNOSIS — R5382 Chronic fatigue, unspecified: Secondary | ICD-10-CM | POA: Diagnosis not present

## 2016-11-17 DIAGNOSIS — Z79899 Other long term (current) drug therapy: Secondary | ICD-10-CM | POA: Diagnosis not present

## 2016-11-17 DIAGNOSIS — R51 Headache: Secondary | ICD-10-CM | POA: Diagnosis not present

## 2016-11-17 DIAGNOSIS — R945 Abnormal results of liver function studies: Secondary | ICD-10-CM | POA: Diagnosis not present

## 2016-11-17 DIAGNOSIS — R55 Syncope and collapse: Secondary | ICD-10-CM | POA: Diagnosis not present

## 2016-11-17 DIAGNOSIS — R110 Nausea: Secondary | ICD-10-CM | POA: Diagnosis not present

## 2016-11-17 DIAGNOSIS — Z6835 Body mass index (BMI) 35.0-35.9, adult: Secondary | ICD-10-CM | POA: Diagnosis not present

## 2016-11-17 DIAGNOSIS — J111 Influenza due to unidentified influenza virus with other respiratory manifestations: Secondary | ICD-10-CM | POA: Diagnosis not present

## 2016-11-17 DIAGNOSIS — R531 Weakness: Secondary | ICD-10-CM | POA: Diagnosis not present

## 2016-11-17 DIAGNOSIS — M50323 Other cervical disc degeneration at C6-C7 level: Secondary | ICD-10-CM | POA: Diagnosis not present

## 2016-11-17 DIAGNOSIS — K219 Gastro-esophageal reflux disease without esophagitis: Secondary | ICD-10-CM | POA: Diagnosis not present

## 2016-11-17 DIAGNOSIS — M50322 Other cervical disc degeneration at C5-C6 level: Secondary | ICD-10-CM | POA: Diagnosis not present

## 2016-11-17 DIAGNOSIS — E039 Hypothyroidism, unspecified: Secondary | ICD-10-CM | POA: Diagnosis not present

## 2016-11-17 DIAGNOSIS — F329 Major depressive disorder, single episode, unspecified: Secondary | ICD-10-CM | POA: Diagnosis not present

## 2016-11-17 DIAGNOSIS — R7989 Other specified abnormal findings of blood chemistry: Secondary | ICD-10-CM | POA: Diagnosis not present

## 2016-12-02 DIAGNOSIS — Z6835 Body mass index (BMI) 35.0-35.9, adult: Secondary | ICD-10-CM | POA: Diagnosis not present

## 2016-12-02 DIAGNOSIS — R232 Flushing: Secondary | ICD-10-CM | POA: Diagnosis not present

## 2017-01-24 DIAGNOSIS — R509 Fever, unspecified: Secondary | ICD-10-CM | POA: Diagnosis not present

## 2017-01-24 DIAGNOSIS — J020 Streptococcal pharyngitis: Secondary | ICD-10-CM | POA: Diagnosis not present

## 2017-01-27 DIAGNOSIS — R5383 Other fatigue: Secondary | ICD-10-CM | POA: Diagnosis not present

## 2017-01-27 DIAGNOSIS — E039 Hypothyroidism, unspecified: Secondary | ICD-10-CM | POA: Diagnosis not present

## 2017-01-27 DIAGNOSIS — F3342 Major depressive disorder, recurrent, in full remission: Secondary | ICD-10-CM | POA: Diagnosis not present

## 2017-01-27 DIAGNOSIS — K210 Gastro-esophageal reflux disease with esophagitis: Secondary | ICD-10-CM | POA: Diagnosis not present

## 2017-01-27 DIAGNOSIS — Z1389 Encounter for screening for other disorder: Secondary | ICD-10-CM | POA: Diagnosis not present

## 2017-04-08 ENCOUNTER — Encounter (INDEPENDENT_AMBULATORY_CARE_PROVIDER_SITE_OTHER): Payer: Self-pay | Admitting: Internal Medicine

## 2017-04-08 ENCOUNTER — Encounter (INDEPENDENT_AMBULATORY_CARE_PROVIDER_SITE_OTHER): Payer: Self-pay

## 2017-04-08 DIAGNOSIS — L821 Other seborrheic keratosis: Secondary | ICD-10-CM | POA: Diagnosis not present

## 2017-04-08 DIAGNOSIS — L570 Actinic keratosis: Secondary | ICD-10-CM | POA: Diagnosis not present

## 2017-04-08 DIAGNOSIS — X32XXXD Exposure to sunlight, subsequent encounter: Secondary | ICD-10-CM | POA: Diagnosis not present

## 2017-04-29 ENCOUNTER — Other Ambulatory Visit (INDEPENDENT_AMBULATORY_CARE_PROVIDER_SITE_OTHER): Payer: Self-pay | Admitting: Internal Medicine

## 2017-04-29 ENCOUNTER — Ambulatory Visit (INDEPENDENT_AMBULATORY_CARE_PROVIDER_SITE_OTHER): Payer: BLUE CROSS/BLUE SHIELD | Admitting: Internal Medicine

## 2017-04-29 ENCOUNTER — Encounter (INDEPENDENT_AMBULATORY_CARE_PROVIDER_SITE_OTHER): Payer: Self-pay | Admitting: *Deleted

## 2017-04-29 ENCOUNTER — Encounter (INDEPENDENT_AMBULATORY_CARE_PROVIDER_SITE_OTHER): Payer: Self-pay | Admitting: Internal Medicine

## 2017-04-29 ENCOUNTER — Telehealth (INDEPENDENT_AMBULATORY_CARE_PROVIDER_SITE_OTHER): Payer: Self-pay | Admitting: *Deleted

## 2017-04-29 VITALS — BP 140/90 | HR 72 | Temp 97.9°F | Ht 67.0 in | Wt 229.7 lb

## 2017-04-29 DIAGNOSIS — K649 Unspecified hemorrhoids: Secondary | ICD-10-CM | POA: Diagnosis not present

## 2017-04-29 DIAGNOSIS — Z1211 Encounter for screening for malignant neoplasm of colon: Secondary | ICD-10-CM | POA: Diagnosis not present

## 2017-04-29 NOTE — Progress Notes (Signed)
   Subjective:    Patient ID: Tanya Fuentes, female    DOB: August 31, 1960, 56 y.o.   MRN: [id number]  01/28/2017   Referred by Dr. Neita CarpSasser for heartburn.  She says her heartburn is not bad, and takes Zantac as needed. She had an EGD/ED last year by Dr. Teena DunkBenson.  She says her last colonoscopy was 10 yr ago by Dr. Cleotis NipperFleishman.  She has bloating. She says by 5pm she has to take her pants off.  She sometimes has pain rt abdomen 12 yrs. She alternates between constipation and diarrhea. She occasionally see blood with constipation. Hx of external hemorrhoids. Uses Fleets supp on occasion. Her appetite is good. No weight loss.   01/29/2017 H and H 13.8 and 42.4     Review of Systems Past Medical History:  Diagnosis Date  . Depression   . Hiatal hernia   . Hypothyroidism     Past Surgical History:  Procedure Laterality Date  . CHOLECYSTECTOMY, LAPAROSCOPIC  1996  . TUBAL LIGATION  1990    Allergies  Allergen Reactions  . Compazine [Prochlorperazine Edisylate] Other (See Comments)    hyper  . Penicillins Rash    Current Outpatient Prescriptions on File Prior to Visit  Medication Sig Dispense Refill  . citalopram (CELEXA) 20 MG tablet Take 20 mg by mouth daily.    Marland Kitchen. Conj Estrogens-Bazedoxifene (DUAVEE) 0.45-20 MG TABS Take 1 tablet by mouth daily.    Marland Kitchen. levothyroxine (SYNTHROID, LEVOTHROID) 100 MCG tablet Take 1 tablet by mouth daily.  0   No current facility-administered medications on file prior to visit.         Objective:   Physical Exam Blood pressure 140/90, pulse 72, temperature 97.9 F (36.6 C), height 5\' 7"  (1.702 m), weight 229 lb 11.2 oz (104.2 kg), last menstrual period 01/03/2011. Alert and oriented. Skin warm and dry. Oral mucosa is moist.   . Sclera anicteric, conjunctivae is pink. Thyroid not enlarged. No cervical lymphadenopathy. Lungs clear. Heart regular rate and rhythm.  Abdomen is soft. Bowel sounds are positive. No hepatomegaly. No abdominal masses felt. No  tenderness.  No edema to lower extremities.           Assessment & Plan:  Screening colonoscopy. Her last colonoscopy was about 10 yrs ago by Dr. Cleotis NipperFleishman for rectal bleeding. Will get those records.

## 2017-04-29 NOTE — Patient Instructions (Signed)
The risks of bleeding, perforation and infection were reviewed with patient.  

## 2017-04-29 NOTE — Telephone Encounter (Signed)
Patient needs trilyte 

## 2017-04-30 MED ORDER — PEG 3350-KCL-NA BICARB-NACL 420 G PO SOLR: 4000.0000 mL | Freq: Once | ORAL | 0 refills | Status: AC

## 2017-05-05 DIAGNOSIS — F3342 Major depressive disorder, recurrent, in full remission: Secondary | ICD-10-CM | POA: Diagnosis not present

## 2017-05-05 DIAGNOSIS — K210 Gastro-esophageal reflux disease with esophagitis: Secondary | ICD-10-CM | POA: Diagnosis not present

## 2017-05-05 DIAGNOSIS — E039 Hypothyroidism, unspecified: Secondary | ICD-10-CM | POA: Diagnosis not present

## 2017-05-05 DIAGNOSIS — R5383 Other fatigue: Secondary | ICD-10-CM | POA: Diagnosis not present

## 2017-05-31 DIAGNOSIS — Z23 Encounter for immunization: Secondary | ICD-10-CM | POA: Diagnosis not present

## 2017-07-06 DIAGNOSIS — Z6837 Body mass index (BMI) 37.0-37.9, adult: Secondary | ICD-10-CM | POA: Diagnosis not present

## 2017-07-06 DIAGNOSIS — N644 Mastodynia: Secondary | ICD-10-CM | POA: Diagnosis not present

## 2017-07-07 DIAGNOSIS — R928 Other abnormal and inconclusive findings on diagnostic imaging of breast: Secondary | ICD-10-CM | POA: Diagnosis not present

## 2017-07-07 DIAGNOSIS — N644 Mastodynia: Secondary | ICD-10-CM | POA: Diagnosis not present

## 2017-07-28 DIAGNOSIS — R5383 Other fatigue: Secondary | ICD-10-CM | POA: Diagnosis not present

## 2017-07-28 DIAGNOSIS — K210 Gastro-esophageal reflux disease with esophagitis: Secondary | ICD-10-CM | POA: Diagnosis not present

## 2017-07-28 DIAGNOSIS — E039 Hypothyroidism, unspecified: Secondary | ICD-10-CM | POA: Diagnosis not present

## 2017-07-28 DIAGNOSIS — F3342 Major depressive disorder, recurrent, in full remission: Secondary | ICD-10-CM | POA: Diagnosis not present

## 2017-07-28 DIAGNOSIS — I10 Essential (primary) hypertension: Secondary | ICD-10-CM | POA: Diagnosis not present

## 2017-08-04 DIAGNOSIS — F3342 Major depressive disorder, recurrent, in full remission: Secondary | ICD-10-CM | POA: Diagnosis not present

## 2017-08-04 DIAGNOSIS — R5383 Other fatigue: Secondary | ICD-10-CM | POA: Diagnosis not present

## 2017-08-04 DIAGNOSIS — K210 Gastro-esophageal reflux disease with esophagitis: Secondary | ICD-10-CM | POA: Diagnosis not present

## 2017-08-04 DIAGNOSIS — E039 Hypothyroidism, unspecified: Secondary | ICD-10-CM | POA: Diagnosis not present

## 2017-08-04 DIAGNOSIS — R51 Headache: Secondary | ICD-10-CM | POA: Diagnosis not present

## 2017-08-05 ENCOUNTER — Encounter (HOSPITAL_COMMUNITY): Admission: RE | Payer: Self-pay | Source: Ambulatory Visit

## 2017-08-05 ENCOUNTER — Ambulatory Visit (HOSPITAL_COMMUNITY)
Admission: RE | Admit: 2017-08-05 | Payer: BLUE CROSS/BLUE SHIELD | Source: Ambulatory Visit | Admitting: Internal Medicine

## 2017-08-05 SURGERY — COLONOSCOPY
Anesthesia: Moderate Sedation

## 2017-08-24 DIAGNOSIS — R200 Anesthesia of skin: Secondary | ICD-10-CM | POA: Diagnosis not present

## 2017-08-24 DIAGNOSIS — R51 Headache: Secondary | ICD-10-CM | POA: Diagnosis not present

## 2017-08-24 DIAGNOSIS — R42 Dizziness and giddiness: Secondary | ICD-10-CM | POA: Diagnosis not present

## 2017-08-24 DIAGNOSIS — M47812 Spondylosis without myelopathy or radiculopathy, cervical region: Secondary | ICD-10-CM | POA: Diagnosis not present

## 2017-08-24 DIAGNOSIS — R531 Weakness: Secondary | ICD-10-CM | POA: Diagnosis not present

## 2017-08-24 DIAGNOSIS — M542 Cervicalgia: Secondary | ICD-10-CM | POA: Diagnosis not present

## 2017-09-30 ENCOUNTER — Ambulatory Visit (INDEPENDENT_AMBULATORY_CARE_PROVIDER_SITE_OTHER): Payer: BLUE CROSS/BLUE SHIELD | Admitting: Otolaryngology

## 2017-09-30 ENCOUNTER — Other Ambulatory Visit (INDEPENDENT_AMBULATORY_CARE_PROVIDER_SITE_OTHER): Payer: Self-pay | Admitting: Otolaryngology

## 2017-09-30 DIAGNOSIS — R51 Headache: Secondary | ICD-10-CM

## 2017-09-30 DIAGNOSIS — J310 Chronic rhinitis: Secondary | ICD-10-CM | POA: Diagnosis not present

## 2017-09-30 DIAGNOSIS — J343 Hypertrophy of nasal turbinates: Secondary | ICD-10-CM

## 2017-09-30 DIAGNOSIS — J320 Chronic maxillary sinusitis: Secondary | ICD-10-CM

## 2017-10-06 ENCOUNTER — Ambulatory Visit (HOSPITAL_COMMUNITY): Payer: BLUE CROSS/BLUE SHIELD

## 2017-10-15 DIAGNOSIS — N200 Calculus of kidney: Secondary | ICD-10-CM | POA: Diagnosis not present

## 2017-10-15 DIAGNOSIS — R109 Unspecified abdominal pain: Secondary | ICD-10-CM | POA: Diagnosis not present

## 2017-10-15 DIAGNOSIS — Z6836 Body mass index (BMI) 36.0-36.9, adult: Secondary | ICD-10-CM | POA: Diagnosis not present

## 2017-10-15 DIAGNOSIS — R319 Hematuria, unspecified: Secondary | ICD-10-CM | POA: Diagnosis not present

## 2017-10-26 DIAGNOSIS — M79671 Pain in right foot: Secondary | ICD-10-CM | POA: Diagnosis not present

## 2017-10-26 DIAGNOSIS — M25579 Pain in unspecified ankle and joints of unspecified foot: Secondary | ICD-10-CM | POA: Diagnosis not present

## 2017-11-23 DIAGNOSIS — Z6836 Body mass index (BMI) 36.0-36.9, adult: Secondary | ICD-10-CM | POA: Diagnosis not present

## 2017-11-23 DIAGNOSIS — Z01419 Encounter for gynecological examination (general) (routine) without abnormal findings: Secondary | ICD-10-CM | POA: Diagnosis not present

## 2018-01-18 DIAGNOSIS — Z79899 Other long term (current) drug therapy: Secondary | ICD-10-CM | POA: Diagnosis not present

## 2018-01-18 DIAGNOSIS — R109 Unspecified abdominal pain: Secondary | ICD-10-CM | POA: Diagnosis not present

## 2018-01-18 DIAGNOSIS — R319 Hematuria, unspecified: Secondary | ICD-10-CM | POA: Diagnosis not present

## 2018-01-18 DIAGNOSIS — E039 Hypothyroidism, unspecified: Secondary | ICD-10-CM | POA: Diagnosis not present

## 2018-01-18 DIAGNOSIS — R1031 Right lower quadrant pain: Secondary | ICD-10-CM | POA: Diagnosis not present

## 2018-01-18 DIAGNOSIS — F329 Major depressive disorder, single episode, unspecified: Secondary | ICD-10-CM | POA: Diagnosis not present

## 2018-01-18 DIAGNOSIS — K219 Gastro-esophageal reflux disease without esophagitis: Secondary | ICD-10-CM | POA: Diagnosis not present

## 2018-02-02 DIAGNOSIS — K210 Gastro-esophageal reflux disease with esophagitis: Secondary | ICD-10-CM | POA: Diagnosis not present

## 2018-02-02 DIAGNOSIS — R945 Abnormal results of liver function studies: Secondary | ICD-10-CM | POA: Diagnosis not present

## 2018-02-02 DIAGNOSIS — R5383 Other fatigue: Secondary | ICD-10-CM | POA: Diagnosis not present

## 2018-02-02 DIAGNOSIS — E039 Hypothyroidism, unspecified: Secondary | ICD-10-CM | POA: Diagnosis not present

## 2018-02-23 DIAGNOSIS — R51 Headache: Secondary | ICD-10-CM | POA: Diagnosis not present

## 2018-02-23 DIAGNOSIS — Z6834 Body mass index (BMI) 34.0-34.9, adult: Secondary | ICD-10-CM | POA: Diagnosis not present

## 2018-02-23 DIAGNOSIS — B349 Viral infection, unspecified: Secondary | ICD-10-CM | POA: Diagnosis not present

## 2018-04-11 ENCOUNTER — Encounter: Payer: Self-pay | Admitting: Obstetrics and Gynecology

## 2018-04-11 ENCOUNTER — Ambulatory Visit: Payer: BLUE CROSS/BLUE SHIELD | Admitting: Obstetrics and Gynecology

## 2018-04-11 ENCOUNTER — Encounter (INDEPENDENT_AMBULATORY_CARE_PROVIDER_SITE_OTHER): Payer: Self-pay

## 2018-04-11 DIAGNOSIS — N816 Rectocele: Secondary | ICD-10-CM | POA: Insufficient documentation

## 2018-04-11 NOTE — Patient Instructions (Signed)
Vaginal Hysterectomy A vaginal hysterectomy is a procedure to remove all or part of the uterus through a small incision in the vagina. In this procedure, your health care provider may remove your entire uterus, including the lower end (cervix). You may need a vaginal hysterectomy to treat:  Uterine fibroids.  A condition that causes the lining of the uterus to grow in other areas (endometriosis).  Problems with pelvic support.  Cancer of the cervix, ovaries, uterus, or tissue that lines the uterus (endometrium).  Excessive (dysfunctional) uterine bleeding.  When removing your uterus, your health care provider may also remove the organs that produce eggs (ovaries) and the tubes that carry eggs to your uterus (fallopian tubes). After a vaginal hysterectomy, you will no longer be able to have a baby. You will also no longer get your menstrual period. Tell a health care provider about:  Any allergies you have.  All medicines you are taking, including vitamins, herbs, eye drops, creams, and over-the-counter medicines.  Any problems you or family members have had with anesthetic medicines.  Any blood disorders you have.  Any surgeries you have had.  Any medical conditions you have.  Whether you are pregnant or may be pregnant. What are the risks? Generally, this is a safe procedure. However, problems may occur, including:  Bleeding.  Infection.  A blood clot that forms in your leg and travels to your lungs (pulmonary embolism).  Damage to surrounding organs.  Pain during sex.  What happens before the procedure?  Ask your health care provider what organs will be removed during surgery.  Ask your health care provider about: ? Changing or stopping your regular medicines. This is especially important if you are taking diabetes medicines or blood thinners. ? Taking medicines such as aspirin and ibuprofen. These medicines can thin your blood. Do not take these medicines before  your procedure if your health care provider instructs you not to.  Follow instructions from your health care provider about eating or drinking restrictions.  Do not use any tobacco products, such as cigarettes, chewing tobacco, and e-cigarettes. If you need help quitting, ask your health care provider.  Plan to have someone take you home after discharge from the hospital. What happens during the procedure?  To reduce your risk of infection: ? Your health care team will wash or sanitize their hands. ? Your skin will be washed with soap.  An IV tube will be inserted into one of your veins.  You may be given antibiotic medicine to help prevent infection.  You will be given one or more of the following: ? A medicine to help you relax (sedative). ? A medicine to numb the area (local anesthetic). ? A medicine to make you fall asleep (general anesthetic). ? A medicine that is injected into an area of your body to numb everything beyond the injection site (regional anesthetic).  Your surgeon will make an incision in your vagina.  Your surgeon will locate and remove all or part of your uterus.  Your ovaries and fallopian tubes may be removed at the same time.  The incision will be closed with stitches (sutures) that dissolve over time. The procedure may vary among health care providers and hospitals. What happens after the procedure?  Your blood pressure, heart rate, breathing rate, and blood oxygen level will be monitored often until the medicines you were given have worn off.  You will be encouraged to get up and walk around after a few hours to help prevent   complications.  You may have IV tubes in place for a few days.  You will be given pain medicine as needed.  Do not drive for 24 hours if you were given a sedative. This information is not intended to replace advice given to you by your health care provider. Make sure you discuss any questions you have with your health care  provider. Document Released: 12/02/2015 Document Revised: 01/16/2016 Document Reviewed: 08/25/2015 Elsevier Interactive Patient Education  2018 [employer] Inc[employer]  

## 2018-04-11 NOTE — Progress Notes (Signed)
Patient ID: Tanya Fuentes, female   DOB: Aug 03, 1961, 57 y.o.   MRN: [id number]    [facility]  @DATE @            Patient name: Tanya Fuentes MRN [id number]  Date of birth: Aug 03, 1961  CC & HPI:  Tanya Fuentes is a 57 y.o. female presenting today for vaginal and rectal pressure as if " something is falling out".  She saw Dr.McLeod in April and discovered distal rectocele which causes trouble with bowel movements, having to strain with sexual intercourse discomfort, vaginal dryness plus deep thrust discomfort. She "feels like an air bubble inside" as well when she goes to the restroom. She will lean forward which causes easier relief of bowel movement while leaning backwards helps her urinate and empty out her bladder. She says that she has both inner and outer hemorrhoids as well as skin tags after having her last child(9lb 11). She is currently on Estrogel for hot flashes and has not had a hysterectomy but has had her tubes tied.  ROS:  ROS +rectocele +external hemorrhoids  +external skin tags +chronic constipation +urinary incontinence -fever -chills All systems are negative except as noted in the HPI and PMH.   Pertinent History Reviewed:   Reviewed: Significant for tubal ligation Medical         Past Medical History:  Diagnosis Date  . Depression   . Hiatal hernia   . Hypothyroidism                               Surgical Hx:    Past Surgical History:  Procedure Laterality Date  . CHOLECYSTECTOMY, LAPAROSCOPIC  1996  . TUBAL LIGATION  1990   Medications: Reviewed & Updated - see associated section                       Current Outpatient Medications:  .  Conj Estrogens-Bazedoxifene (DUAVEE) 0.45-20 MG TABS, Take 1 tablet by mouth daily., Disp: , Rfl:  .  Estradiol (ESTROGEL) 0.75 MG/1.25 GM (0.06%) topical gel, Place 50 g onto the skin daily., Disp: , Rfl:  .  FLUoxetine (PROZAC) 20 MG capsule, Take 20 mg by mouth daily., Disp: , Rfl:  .   levothyroxine (SYNTHROID, LEVOTHROID) 100 MCG tablet, Take 1 tablet by mouth daily., Disp: , Rfl: 0 .  citalopram (CELEXA) 20 MG tablet, Take 20 mg by mouth daily., Disp: , Rfl:  .  ranitidine (ZANTAC) 150 MG capsule, Take 150 mg by mouth as needed for heartburn., Disp: , Rfl:    Social History: Reviewed -  reports that she has never smoked. She has never used smokeless tobacco.  Objective Findings:  Vitals: Blood pressure 117/70, pulse 64, height 5\' 6"  (1.676 m), weight 223 lb 9.6 oz (101.4 kg), last menstrual period 01/03/2011.  PHYSICAL EXAMINATION General appearance - alert, well appearing, and in no distress and oriented to person, place, and time Mental status - alert, oriented to person, place, and time, normal mood, behavior, speech, dress, motor activity, and thought processes, affect appropriate to mood  PELVIC External genitalia - external skin tags Vagina -tenderness in right sidewall Cervix - 1st degree uterine descensus Uterus - antiflexed Rectal - Rectocele, external hemorrhoids non-thrombosed  Assessment & Plan:   A:  1.  rectocele 2. External hemorrhoids 3. 1 st degree uterine descensus  P:  1.  discussed Rx phentermine for weight  loss preop, pt to attempt Atkins diet on her own. 2. 2 week f/u Transvaginal u/s 3. F/u for September appointment to discuss surgical options/ preop. 4. Pt considering Hysterectomy , anterior and posterior repair in October. 5. Will need to discuss prophylactic Salpingectomy or possible Bilateral salpingooophorectomy  At f/u appt.     By signing my name below, I, Arnette NorrisMari Johnson, attest that this documentation has been prepared under the direction and in the presence of Tilda BurrowFerguson, Jaxin Fulfer V, MD. Electronically Signed: Arnette NorrisMari Johnson Medical Scribe. 04/11/18. 11:25 AM.  I personally performed the services described in this documentation, which was SCRIBED in my presence. The recorded information has been reviewed and considered accurate. It  has been edited as necessary during review. Tilda BurrowJohn V Cashe Gatt, MD

## 2018-04-22 ENCOUNTER — Other Ambulatory Visit: Payer: Self-pay | Admitting: Obstetrics and Gynecology

## 2018-04-22 DIAGNOSIS — R102 Pelvic and perineal pain: Secondary | ICD-10-CM

## 2018-04-26 ENCOUNTER — Other Ambulatory Visit: Payer: BLUE CROSS/BLUE SHIELD

## 2018-05-04 ENCOUNTER — Other Ambulatory Visit: Payer: BLUE CROSS/BLUE SHIELD

## 2018-05-09 ENCOUNTER — Ambulatory Visit: Payer: BLUE CROSS/BLUE SHIELD | Admitting: Obstetrics and Gynecology

## 2018-05-10 DIAGNOSIS — X58XXXA Exposure to other specified factors, initial encounter: Secondary | ICD-10-CM | POA: Diagnosis not present

## 2018-05-10 DIAGNOSIS — R791 Abnormal coagulation profile: Secondary | ICD-10-CM | POA: Diagnosis not present

## 2018-05-10 DIAGNOSIS — F329 Major depressive disorder, single episode, unspecified: Secondary | ICD-10-CM | POA: Diagnosis not present

## 2018-05-10 DIAGNOSIS — Z79899 Other long term (current) drug therapy: Secondary | ICD-10-CM | POA: Diagnosis not present

## 2018-05-10 DIAGNOSIS — S29012A Strain of muscle and tendon of back wall of thorax, initial encounter: Secondary | ICD-10-CM | POA: Diagnosis not present

## 2018-05-10 DIAGNOSIS — E039 Hypothyroidism, unspecified: Secondary | ICD-10-CM | POA: Diagnosis not present

## 2018-05-10 DIAGNOSIS — R0789 Other chest pain: Secondary | ICD-10-CM | POA: Diagnosis not present

## 2018-05-10 DIAGNOSIS — R079 Chest pain, unspecified: Secondary | ICD-10-CM | POA: Diagnosis not present

## 2018-05-16 DIAGNOSIS — B029 Zoster without complications: Secondary | ICD-10-CM | POA: Diagnosis not present

## 2018-05-16 DIAGNOSIS — Z6835 Body mass index (BMI) 35.0-35.9, adult: Secondary | ICD-10-CM | POA: Diagnosis not present

## 2018-05-18 ENCOUNTER — Ambulatory Visit: Payer: BLUE CROSS/BLUE SHIELD | Admitting: Obstetrics and Gynecology

## 2018-05-18 ENCOUNTER — Other Ambulatory Visit: Payer: BLUE CROSS/BLUE SHIELD

## 2018-05-18 DIAGNOSIS — M549 Dorsalgia, unspecified: Secondary | ICD-10-CM | POA: Diagnosis not present

## 2018-05-18 DIAGNOSIS — R1012 Left upper quadrant pain: Secondary | ICD-10-CM | POA: Diagnosis not present

## 2018-05-18 DIAGNOSIS — R079 Chest pain, unspecified: Secondary | ICD-10-CM | POA: Diagnosis not present

## 2018-05-18 DIAGNOSIS — R110 Nausea: Secondary | ICD-10-CM | POA: Diagnosis not present

## 2018-05-18 DIAGNOSIS — K210 Gastro-esophageal reflux disease with esophagitis: Secondary | ICD-10-CM | POA: Diagnosis not present

## 2018-05-23 DIAGNOSIS — R1010 Upper abdominal pain, unspecified: Secondary | ICD-10-CM | POA: Diagnosis not present

## 2018-05-27 ENCOUNTER — Ambulatory Visit (INDEPENDENT_AMBULATORY_CARE_PROVIDER_SITE_OTHER): Payer: BLUE CROSS/BLUE SHIELD | Admitting: Internal Medicine

## 2018-06-29 ENCOUNTER — Ambulatory Visit: Payer: BLUE CROSS/BLUE SHIELD | Admitting: Obstetrics and Gynecology

## 2018-06-29 ENCOUNTER — Encounter: Payer: Self-pay | Admitting: Obstetrics and Gynecology

## 2018-06-29 ENCOUNTER — Ambulatory Visit (INDEPENDENT_AMBULATORY_CARE_PROVIDER_SITE_OTHER): Payer: BLUE CROSS/BLUE SHIELD

## 2018-06-29 ENCOUNTER — Other Ambulatory Visit: Payer: Self-pay

## 2018-06-29 VITALS — BP 137/73 | HR 73 | Ht 72.0 in | Wt 230.6 lb

## 2018-06-29 DIAGNOSIS — N816 Rectocele: Secondary | ICD-10-CM | POA: Diagnosis not present

## 2018-06-29 DIAGNOSIS — R9389 Abnormal findings on diagnostic imaging of other specified body structures: Secondary | ICD-10-CM

## 2018-06-29 DIAGNOSIS — R102 Pelvic and perineal pain: Secondary | ICD-10-CM | POA: Diagnosis not present

## 2018-06-29 NOTE — Progress Notes (Signed)
PELVIC US TA/TV:heterogeneous anteverted uterus,wnl,thickened heterogeneous endometrium 5.8 mm (no color flow),normal right ovary,normal left ovary on T/A images,unable to visualize left ovary TV,no free fluid,lots of pelvic pain during ultrasound

## 2018-06-29 NOTE — Progress Notes (Signed)
Patient ID: Tanya Fuentes, female   DOB: 1961-01-28, 57 y.o.   MRN: [id number]  Discussion: 1. Discussed with pt TV U/S done today that showed : small anterior uterus That is unlikely to be symptomatic Minimally thickened endometrial stripe. Since we're discussing surgical options will proceed with endometrial biopsy. At end of discussion, pt had opportunity to ask questions and has no further questions at this time.   Specific discussion of 15 as noted above. Greater than 50% was spent in counseling and coordination of care with the patient.   Total time greater than: 15 minutes.   A: 1. 1st degree uterine descensus  2. Rectocele  P: 1. Endo Biopsy x 1 week 2. Then decide on schedule of patient's desired posterior repair and pre-op   By signing my name below, I, Arnette Norris, attest that this documentation has been prepared under the direction and in the presence of Tilda Burrow, MD. Electronically Signed: Arnette Norris Medical Scribe. 06/29/18. 12:44 PM.  I personally performed the services described in this documentation, which was SCRIBED in my presence. The recorded information has been reviewed and considered accurate. It has been edited as necessary during review. Tilda Burrow, MD

## 2018-07-12 ENCOUNTER — Other Ambulatory Visit: Payer: BLUE CROSS/BLUE SHIELD | Admitting: Obstetrics and Gynecology

## 2018-08-01 DIAGNOSIS — K210 Gastro-esophageal reflux disease with esophagitis: Secondary | ICD-10-CM | POA: Diagnosis not present

## 2018-08-01 DIAGNOSIS — F3342 Major depressive disorder, recurrent, in full remission: Secondary | ICD-10-CM | POA: Diagnosis not present

## 2018-08-01 DIAGNOSIS — I10 Essential (primary) hypertension: Secondary | ICD-10-CM | POA: Diagnosis not present

## 2018-08-01 DIAGNOSIS — E039 Hypothyroidism, unspecified: Secondary | ICD-10-CM | POA: Diagnosis not present

## 2018-08-29 DIAGNOSIS — R350 Frequency of micturition: Secondary | ICD-10-CM | POA: Diagnosis not present

## 2018-08-29 DIAGNOSIS — M545 Low back pain: Secondary | ICD-10-CM | POA: Diagnosis not present

## 2018-08-29 DIAGNOSIS — Z6836 Body mass index (BMI) 36.0-36.9, adult: Secondary | ICD-10-CM | POA: Diagnosis not present

## 2018-09-22 DIAGNOSIS — H40013 Open angle with borderline findings, low risk, bilateral: Secondary | ICD-10-CM | POA: Diagnosis not present

## 2018-10-03 DIAGNOSIS — Z6836 Body mass index (BMI) 36.0-36.9, adult: Secondary | ICD-10-CM | POA: Diagnosis not present

## 2018-10-03 DIAGNOSIS — N3090 Cystitis, unspecified without hematuria: Secondary | ICD-10-CM | POA: Diagnosis not present

## 2018-10-03 DIAGNOSIS — M545 Low back pain: Secondary | ICD-10-CM | POA: Diagnosis not present

## 2018-10-06 DIAGNOSIS — R319 Hematuria, unspecified: Secondary | ICD-10-CM | POA: Diagnosis not present

## 2018-10-06 DIAGNOSIS — N3090 Cystitis, unspecified without hematuria: Secondary | ICD-10-CM | POA: Diagnosis not present

## 2018-11-21 ENCOUNTER — Ambulatory Visit (INDEPENDENT_AMBULATORY_CARE_PROVIDER_SITE_OTHER): Payer: BLUE CROSS/BLUE SHIELD | Admitting: Internal Medicine

## 2019-01-13 ENCOUNTER — Ambulatory Visit (INDEPENDENT_AMBULATORY_CARE_PROVIDER_SITE_OTHER): Payer: BLUE CROSS/BLUE SHIELD | Admitting: Urology

## 2019-01-13 ENCOUNTER — Other Ambulatory Visit (HOSPITAL_COMMUNITY)
Admission: AD | Admit: 2019-01-13 | Discharge: 2019-01-13 | Disposition: A | Discharge status: Home / Self Care | Payer: BLUE CROSS/BLUE SHIELD | Source: Other Acute Inpatient Hospital | Attending: Urology | Admitting: Urology

## 2019-01-13 DIAGNOSIS — N952 Postmenopausal atrophic vaginitis: Secondary | ICD-10-CM | POA: Diagnosis not present

## 2019-01-13 DIAGNOSIS — R3121 Asymptomatic microscopic hematuria: Secondary | ICD-10-CM | POA: Diagnosis not present

## 2019-01-13 DIAGNOSIS — Z87440 Personal history of urinary (tract) infections: Secondary | ICD-10-CM

## 2019-01-13 DIAGNOSIS — N816 Rectocele: Secondary | ICD-10-CM | POA: Diagnosis not present

## 2019-01-13 LAB — URINALYSIS, COMPLETE (UACMP) WITH MICROSCOPIC
Bilirubin Urine: NEGATIVE
Glucose, UA: NEGATIVE mg/dL
Ketones, ur: 20 mg/dL — AB
Nitrite: NEGATIVE
Protein, ur: NEGATIVE mg/dL
Specific Gravity, Urine: 1.027 (ref 1.005–1.030)
pH: 5 (ref 5.0–8.0)

## 2019-01-29 DIAGNOSIS — J029 Acute pharyngitis, unspecified: Secondary | ICD-10-CM | POA: Diagnosis not present

## 2019-01-29 DIAGNOSIS — R509 Fever, unspecified: Secondary | ICD-10-CM | POA: Diagnosis not present

## 2019-01-29 DIAGNOSIS — R21 Rash and other nonspecific skin eruption: Secondary | ICD-10-CM | POA: Diagnosis not present

## 2019-01-29 DIAGNOSIS — W57XXXA Bitten or stung by nonvenomous insect and other nonvenomous arthropods, initial encounter: Secondary | ICD-10-CM | POA: Diagnosis not present

## 2019-02-06 DIAGNOSIS — W57XXXA Bitten or stung by nonvenomous insect and other nonvenomous arthropods, initial encounter: Secondary | ICD-10-CM | POA: Diagnosis not present

## 2019-02-06 DIAGNOSIS — R591 Generalized enlarged lymph nodes: Secondary | ICD-10-CM | POA: Diagnosis not present

## 2019-02-06 DIAGNOSIS — Z6836 Body mass index (BMI) 36.0-36.9, adult: Secondary | ICD-10-CM | POA: Diagnosis not present

## 2019-02-06 DIAGNOSIS — S0096XA Insect bite (nonvenomous) of unspecified part of head, initial encounter: Secondary | ICD-10-CM | POA: Diagnosis not present

## 2019-02-10 DIAGNOSIS — Z6837 Body mass index (BMI) 37.0-37.9, adult: Secondary | ICD-10-CM | POA: Diagnosis not present

## 2019-02-10 DIAGNOSIS — Z01419 Encounter for gynecological examination (general) (routine) without abnormal findings: Secondary | ICD-10-CM | POA: Diagnosis not present

## 2019-02-10 DIAGNOSIS — Z79890 Hormone replacement therapy (postmenopausal): Secondary | ICD-10-CM | POA: Diagnosis not present

## 2019-02-26 DIAGNOSIS — Z6836 Body mass index (BMI) 36.0-36.9, adult: Secondary | ICD-10-CM | POA: Diagnosis not present

## 2019-02-26 DIAGNOSIS — M79641 Pain in right hand: Secondary | ICD-10-CM | POA: Diagnosis not present

## 2019-02-27 ENCOUNTER — Ambulatory Visit (INDEPENDENT_AMBULATORY_CARE_PROVIDER_SITE_OTHER): Payer: BLUE CROSS/BLUE SHIELD | Admitting: Internal Medicine

## 2019-03-18 DIAGNOSIS — Z6836 Body mass index (BMI) 36.0-36.9, adult: Secondary | ICD-10-CM | POA: Diagnosis not present

## 2019-03-18 DIAGNOSIS — M259 Joint disorder, unspecified: Secondary | ICD-10-CM | POA: Diagnosis not present

## 2019-03-18 DIAGNOSIS — M545 Low back pain: Secondary | ICD-10-CM | POA: Diagnosis not present

## 2019-04-03 DIAGNOSIS — M545 Low back pain: Secondary | ICD-10-CM | POA: Diagnosis not present

## 2019-04-03 DIAGNOSIS — M549 Dorsalgia, unspecified: Secondary | ICD-10-CM | POA: Diagnosis not present

## 2019-04-03 DIAGNOSIS — M479 Spondylosis, unspecified: Secondary | ICD-10-CM | POA: Diagnosis not present

## 2019-04-11 DIAGNOSIS — N951 Menopausal and female climacteric states: Secondary | ICD-10-CM | POA: Diagnosis not present

## 2019-04-11 DIAGNOSIS — R5383 Other fatigue: Secondary | ICD-10-CM | POA: Diagnosis not present

## 2019-04-17 DIAGNOSIS — R5383 Other fatigue: Secondary | ICD-10-CM | POA: Diagnosis not present

## 2019-04-17 DIAGNOSIS — N951 Menopausal and female climacteric states: Secondary | ICD-10-CM | POA: Diagnosis not present

## 2019-04-17 DIAGNOSIS — R6882 Decreased libido: Secondary | ICD-10-CM | POA: Diagnosis not present

## 2019-04-17 DIAGNOSIS — N898 Other specified noninflammatory disorders of vagina: Secondary | ICD-10-CM | POA: Diagnosis not present

## 2019-06-06 ENCOUNTER — Other Ambulatory Visit: Payer: Self-pay

## 2019-06-06 DIAGNOSIS — Z20828 Contact with and (suspected) exposure to other viral communicable diseases: Secondary | ICD-10-CM | POA: Diagnosis not present

## 2019-06-06 DIAGNOSIS — Z20822 Contact with and (suspected) exposure to covid-19: Secondary | ICD-10-CM

## 2019-06-07 LAB — NOVEL CORONAVIRUS, NAA: SARS-CoV-2, NAA: NOT DETECTED

## 2019-06-11 DIAGNOSIS — R079 Chest pain, unspecified: Secondary | ICD-10-CM | POA: Diagnosis not present

## 2019-06-11 DIAGNOSIS — Z5321 Procedure and treatment not carried out due to patient leaving prior to being seen by health care provider: Secondary | ICD-10-CM | POA: Diagnosis not present

## 2019-07-05 ENCOUNTER — Encounter: Payer: Self-pay | Admitting: *Deleted

## 2019-07-06 ENCOUNTER — Ambulatory Visit: Payer: BC Managed Care – PPO | Admitting: Cardiovascular Disease

## 2019-08-25 HISTORY — PX: ABDOMINAL HYSTERECTOMY: SHX81

## 2019-09-15 DIAGNOSIS — Z20828 Contact with and (suspected) exposure to other viral communicable diseases: Secondary | ICD-10-CM | POA: Diagnosis not present

## 2019-10-09 DIAGNOSIS — Z6837 Body mass index (BMI) 37.0-37.9, adult: Secondary | ICD-10-CM | POA: Diagnosis not present

## 2019-10-09 DIAGNOSIS — E039 Hypothyroidism, unspecified: Secondary | ICD-10-CM | POA: Diagnosis not present

## 2019-10-09 DIAGNOSIS — R079 Chest pain, unspecified: Secondary | ICD-10-CM | POA: Diagnosis not present

## 2019-10-09 DIAGNOSIS — R0789 Other chest pain: Secondary | ICD-10-CM | POA: Diagnosis not present

## 2019-10-09 DIAGNOSIS — I10 Essential (primary) hypertension: Secondary | ICD-10-CM | POA: Diagnosis not present

## 2019-10-19 DIAGNOSIS — J019 Acute sinusitis, unspecified: Secondary | ICD-10-CM | POA: Diagnosis not present

## 2019-10-19 DIAGNOSIS — Z20828 Contact with and (suspected) exposure to other viral communicable diseases: Secondary | ICD-10-CM | POA: Diagnosis not present

## 2019-10-24 DIAGNOSIS — I208 Other forms of angina pectoris: Secondary | ICD-10-CM | POA: Diagnosis not present

## 2019-10-24 DIAGNOSIS — K2100 Gastro-esophageal reflux disease with esophagitis, without bleeding: Secondary | ICD-10-CM | POA: Diagnosis not present

## 2019-10-24 DIAGNOSIS — I10 Essential (primary) hypertension: Secondary | ICD-10-CM | POA: Diagnosis not present

## 2019-10-24 DIAGNOSIS — R0789 Other chest pain: Secondary | ICD-10-CM | POA: Diagnosis not present

## 2019-11-16 DIAGNOSIS — R0602 Shortness of breath: Secondary | ICD-10-CM | POA: Diagnosis not present

## 2019-11-16 DIAGNOSIS — R079 Chest pain, unspecified: Secondary | ICD-10-CM | POA: Diagnosis not present

## 2019-11-16 DIAGNOSIS — I10 Essential (primary) hypertension: Secondary | ICD-10-CM | POA: Diagnosis not present

## 2019-11-16 DIAGNOSIS — R002 Palpitations: Secondary | ICD-10-CM | POA: Diagnosis not present

## 2019-11-22 DIAGNOSIS — M791 Myalgia, unspecified site: Secondary | ICD-10-CM | POA: Diagnosis not present

## 2019-11-22 DIAGNOSIS — Z20828 Contact with and (suspected) exposure to other viral communicable diseases: Secondary | ICD-10-CM | POA: Diagnosis not present

## 2019-12-06 DIAGNOSIS — M549 Dorsalgia, unspecified: Secondary | ICD-10-CM | POA: Diagnosis not present

## 2019-12-06 DIAGNOSIS — Z6838 Body mass index (BMI) 38.0-38.9, adult: Secondary | ICD-10-CM | POA: Diagnosis not present

## 2019-12-08 DIAGNOSIS — Z6838 Body mass index (BMI) 38.0-38.9, adult: Secondary | ICD-10-CM | POA: Diagnosis not present

## 2019-12-08 DIAGNOSIS — N939 Abnormal uterine and vaginal bleeding, unspecified: Secondary | ICD-10-CM | POA: Diagnosis not present

## 2019-12-08 DIAGNOSIS — Z23 Encounter for immunization: Secondary | ICD-10-CM | POA: Diagnosis not present

## 2019-12-13 DIAGNOSIS — R0602 Shortness of breath: Secondary | ICD-10-CM | POA: Diagnosis not present

## 2019-12-13 DIAGNOSIS — R079 Chest pain, unspecified: Secondary | ICD-10-CM | POA: Diagnosis not present

## 2019-12-13 DIAGNOSIS — I10 Essential (primary) hypertension: Secondary | ICD-10-CM | POA: Diagnosis not present

## 2019-12-13 DIAGNOSIS — I340 Nonrheumatic mitral (valve) insufficiency: Secondary | ICD-10-CM | POA: Diagnosis not present

## 2019-12-19 DIAGNOSIS — N950 Postmenopausal bleeding: Secondary | ICD-10-CM | POA: Diagnosis not present

## 2019-12-19 DIAGNOSIS — N939 Abnormal uterine and vaginal bleeding, unspecified: Secondary | ICD-10-CM | POA: Diagnosis not present

## 2019-12-20 DIAGNOSIS — R079 Chest pain, unspecified: Secondary | ICD-10-CM | POA: Diagnosis not present

## 2019-12-20 DIAGNOSIS — R0602 Shortness of breath: Secondary | ICD-10-CM | POA: Diagnosis not present

## 2019-12-20 DIAGNOSIS — I10 Essential (primary) hypertension: Secondary | ICD-10-CM | POA: Diagnosis not present

## 2020-01-01 DIAGNOSIS — Z01818 Encounter for other preprocedural examination: Secondary | ICD-10-CM | POA: Diagnosis not present

## 2020-01-03 DIAGNOSIS — Z6839 Body mass index (BMI) 39.0-39.9, adult: Secondary | ICD-10-CM | POA: Diagnosis not present

## 2020-01-03 DIAGNOSIS — N882 Stricture and stenosis of cervix uteri: Secondary | ICD-10-CM | POA: Diagnosis not present

## 2020-01-03 DIAGNOSIS — Z880 Allergy status to penicillin: Secondary | ICD-10-CM | POA: Diagnosis not present

## 2020-01-03 DIAGNOSIS — R9389 Abnormal findings on diagnostic imaging of other specified body structures: Secondary | ICD-10-CM | POA: Diagnosis not present

## 2020-01-03 DIAGNOSIS — Z888 Allergy status to other drugs, medicaments and biological substances status: Secondary | ICD-10-CM | POA: Diagnosis not present

## 2020-01-03 DIAGNOSIS — Z9049 Acquired absence of other specified parts of digestive tract: Secondary | ICD-10-CM | POA: Diagnosis not present

## 2020-01-03 DIAGNOSIS — N950 Postmenopausal bleeding: Secondary | ICD-10-CM | POA: Diagnosis not present

## 2020-01-03 DIAGNOSIS — N840 Polyp of corpus uteri: Secondary | ICD-10-CM | POA: Diagnosis not present

## 2020-01-03 DIAGNOSIS — Z79899 Other long term (current) drug therapy: Secondary | ICD-10-CM | POA: Diagnosis not present

## 2020-01-03 DIAGNOSIS — I10 Essential (primary) hypertension: Secondary | ICD-10-CM | POA: Diagnosis not present

## 2020-01-03 DIAGNOSIS — E039 Hypothyroidism, unspecified: Secondary | ICD-10-CM | POA: Diagnosis not present

## 2020-01-03 DIAGNOSIS — E669 Obesity, unspecified: Secondary | ICD-10-CM | POA: Diagnosis not present

## 2020-01-03 DIAGNOSIS — Z79890 Hormone replacement therapy (postmenopausal): Secondary | ICD-10-CM | POA: Diagnosis not present

## 2020-01-03 DIAGNOSIS — F329 Major depressive disorder, single episode, unspecified: Secondary | ICD-10-CM | POA: Diagnosis not present

## 2020-01-12 DIAGNOSIS — Z23 Encounter for immunization: Secondary | ICD-10-CM | POA: Diagnosis not present

## 2020-03-10 DIAGNOSIS — R1031 Right lower quadrant pain: Secondary | ICD-10-CM | POA: Diagnosis not present

## 2020-03-10 DIAGNOSIS — Z6838 Body mass index (BMI) 38.0-38.9, adult: Secondary | ICD-10-CM | POA: Diagnosis not present

## 2020-03-10 DIAGNOSIS — R300 Dysuria: Secondary | ICD-10-CM | POA: Diagnosis not present

## 2020-03-11 DIAGNOSIS — Z9851 Tubal ligation status: Secondary | ICD-10-CM | POA: Diagnosis not present

## 2020-03-11 DIAGNOSIS — Z880 Allergy status to penicillin: Secondary | ICD-10-CM | POA: Diagnosis not present

## 2020-03-11 DIAGNOSIS — R1031 Right lower quadrant pain: Secondary | ICD-10-CM | POA: Diagnosis not present

## 2020-03-11 DIAGNOSIS — Z9049 Acquired absence of other specified parts of digestive tract: Secondary | ICD-10-CM | POA: Diagnosis not present

## 2020-03-12 DIAGNOSIS — R1031 Right lower quadrant pain: Secondary | ICD-10-CM | POA: Diagnosis not present

## 2020-03-12 DIAGNOSIS — R197 Diarrhea, unspecified: Secondary | ICD-10-CM | POA: Diagnosis not present

## 2020-03-12 DIAGNOSIS — Z6838 Body mass index (BMI) 38.0-38.9, adult: Secondary | ICD-10-CM | POA: Diagnosis not present

## 2020-03-12 DIAGNOSIS — R110 Nausea: Secondary | ICD-10-CM | POA: Diagnosis not present

## 2020-03-13 DIAGNOSIS — R197 Diarrhea, unspecified: Secondary | ICD-10-CM | POA: Diagnosis not present

## 2020-03-13 DIAGNOSIS — R1031 Right lower quadrant pain: Secondary | ICD-10-CM | POA: Diagnosis not present

## 2020-03-13 DIAGNOSIS — K760 Fatty (change of) liver, not elsewhere classified: Secondary | ICD-10-CM | POA: Diagnosis not present

## 2020-03-13 DIAGNOSIS — Z9049 Acquired absence of other specified parts of digestive tract: Secondary | ICD-10-CM | POA: Diagnosis not present

## 2020-03-13 DIAGNOSIS — N858 Other specified noninflammatory disorders of uterus: Secondary | ICD-10-CM | POA: Diagnosis not present

## 2020-03-13 DIAGNOSIS — K7689 Other specified diseases of liver: Secondary | ICD-10-CM | POA: Diagnosis not present

## 2020-03-13 DIAGNOSIS — R110 Nausea: Secondary | ICD-10-CM | POA: Diagnosis not present

## 2020-03-15 DIAGNOSIS — Z6838 Body mass index (BMI) 38.0-38.9, adult: Secondary | ICD-10-CM | POA: Diagnosis not present

## 2020-03-15 DIAGNOSIS — I700 Atherosclerosis of aorta: Secondary | ICD-10-CM | POA: Diagnosis not present

## 2020-03-15 DIAGNOSIS — K760 Fatty (change of) liver, not elsewhere classified: Secondary | ICD-10-CM | POA: Diagnosis not present

## 2020-03-15 DIAGNOSIS — N3289 Other specified disorders of bladder: Secondary | ICD-10-CM | POA: Diagnosis not present

## 2020-03-15 DIAGNOSIS — R319 Hematuria, unspecified: Secondary | ICD-10-CM | POA: Diagnosis not present

## 2020-03-15 DIAGNOSIS — K7689 Other specified diseases of liver: Secondary | ICD-10-CM | POA: Diagnosis not present

## 2020-03-15 DIAGNOSIS — R109 Unspecified abdominal pain: Secondary | ICD-10-CM | POA: Diagnosis not present

## 2020-04-02 DIAGNOSIS — Z20828 Contact with and (suspected) exposure to other viral communicable diseases: Secondary | ICD-10-CM | POA: Diagnosis not present

## 2020-05-09 ENCOUNTER — Ambulatory Visit (INDEPENDENT_AMBULATORY_CARE_PROVIDER_SITE_OTHER): Payer: BC Managed Care – PPO | Admitting: Gastroenterology

## 2020-05-17 DIAGNOSIS — N8501 Benign endometrial hyperplasia: Secondary | ICD-10-CM | POA: Diagnosis not present

## 2020-05-17 DIAGNOSIS — Z01812 Encounter for preprocedural laboratory examination: Secondary | ICD-10-CM | POA: Diagnosis not present

## 2020-05-17 DIAGNOSIS — N939 Abnormal uterine and vaginal bleeding, unspecified: Secondary | ICD-10-CM | POA: Diagnosis not present

## 2020-05-17 DIAGNOSIS — R102 Pelvic and perineal pain: Secondary | ICD-10-CM | POA: Diagnosis not present

## 2020-05-17 DIAGNOSIS — Z01818 Encounter for other preprocedural examination: Secondary | ICD-10-CM | POA: Diagnosis not present

## 2020-05-17 DIAGNOSIS — F329 Major depressive disorder, single episode, unspecified: Secondary | ICD-10-CM | POA: Diagnosis not present

## 2020-05-17 DIAGNOSIS — E039 Hypothyroidism, unspecified: Secondary | ICD-10-CM | POA: Diagnosis not present

## 2020-05-17 DIAGNOSIS — N938 Other specified abnormal uterine and vaginal bleeding: Secondary | ICD-10-CM | POA: Diagnosis not present

## 2020-05-17 DIAGNOSIS — Z880 Allergy status to penicillin: Secondary | ICD-10-CM | POA: Diagnosis not present

## 2020-05-17 DIAGNOSIS — Z20822 Contact with and (suspected) exposure to covid-19: Secondary | ICD-10-CM | POA: Diagnosis not present

## 2020-06-20 DIAGNOSIS — Z6838 Body mass index (BMI) 38.0-38.9, adult: Secondary | ICD-10-CM | POA: Diagnosis not present

## 2020-06-20 DIAGNOSIS — R319 Hematuria, unspecified: Secondary | ICD-10-CM | POA: Diagnosis not present

## 2020-07-19 DIAGNOSIS — R509 Fever, unspecified: Secondary | ICD-10-CM | POA: Diagnosis not present

## 2020-07-19 DIAGNOSIS — L0390 Cellulitis, unspecified: Secondary | ICD-10-CM | POA: Diagnosis not present

## 2020-07-24 DIAGNOSIS — R509 Fever, unspecified: Secondary | ICD-10-CM | POA: Diagnosis not present

## 2020-07-24 DIAGNOSIS — L0390 Cellulitis, unspecified: Secondary | ICD-10-CM | POA: Diagnosis not present

## 2020-12-05 ENCOUNTER — Other Ambulatory Visit: Payer: Self-pay

## 2020-12-05 ENCOUNTER — Ambulatory Visit (INDEPENDENT_AMBULATORY_CARE_PROVIDER_SITE_OTHER): Payer: BC Managed Care – PPO | Admitting: Urology

## 2020-12-05 DIAGNOSIS — Z87440 Personal history of urinary (tract) infections: Secondary | ICD-10-CM | POA: Diagnosis not present

## 2020-12-05 DIAGNOSIS — R3129 Other microscopic hematuria: Secondary | ICD-10-CM | POA: Diagnosis not present

## 2020-12-05 DIAGNOSIS — N952 Postmenopausal atrophic vaginitis: Secondary | ICD-10-CM | POA: Diagnosis not present

## 2020-12-05 LAB — URINALYSIS, ROUTINE W REFLEX MICROSCOPIC
Bilirubin, UA: NEGATIVE
Glucose, UA: NEGATIVE
Ketones, UA: NEGATIVE
Leukocytes,UA: NEGATIVE
Nitrite, UA: NEGATIVE
Protein,UA: NEGATIVE
Specific Gravity, UA: 1.025 (ref 1.005–1.030)
Urobilinogen, Ur: 0.2 mg/dL (ref 0.2–1.0)
pH, UA: 6 (ref 5.0–7.5)

## 2020-12-05 LAB — MICROSCOPIC EXAMINATION: Renal Epithel, UA: NONE SEEN /hpf

## 2020-12-05 MED ORDER — PREMARIN 0.625 MG/GM VA CREA: 1.0000 | TOPICAL_CREAM | Freq: Every day | VAGINAL | 12 refills | Status: DC

## 2020-12-05 NOTE — Progress Notes (Signed)
Subjective:  1. Personal history of urinary tract infection   2. Microhematuria   3. Vaginal atrophy      Tanya Fuentes is a 60 yo female who I last saw for recurrent UTI's in 5/20.   She was placed on premarin cream at that time.  She has had B Strep on 3 cultures from  9/19, 8/20 and 10/21 at [facility].  Her Cr is 1.02 on 05/20/20.  She had a hysterectomy in 9/21 and has had more since.  She has chronic microhematuria with 3-10 RBC.  Her symptoms include frequency and pressure.  She has nocturia x 3.  She has some post void dribbling since the hysterectomy.   She has some lower back and upper leg pain.  She is not on antibiotics.   She was last treated a month ago.  She had a culture.      ROS:  ROS:  A complete review of systems was performed.  All systems are negative except for pertinent findings as noted.   ROS  Allergies  Allergen Reactions  . Compazine [Prochlorperazine Edisylate] Other (See Comments)    hyper  . Penicillins Rash    Outpatient Encounter Medications as of 12/05/2020  Medication Sig  . conjugated estrogens (PREMARIN) vaginal cream Place 1 Applicatorful vaginally at bedtime.  Marland Kitchen FLUoxetine (PROZAC) 20 MG capsule Take 20 mg by mouth daily.  Marland Kitchen levothyroxine (SYNTHROID, LEVOTHROID) 100 MCG tablet Take 1 tablet by mouth daily.  . pantoprazole (PROTONIX) 20 MG tablet Take 20 mg by mouth daily.  . [DISCONTINUED] citalopram (CELEXA) 20 MG tablet Take 20 mg by mouth daily. (Patient not taking: Reported on 12/05/2020)  . [DISCONTINUED] Conj Estrogens-Bazedoxifene 0.45-20 MG TABS Take 1 tablet by mouth daily.  . [DISCONTINUED] Estradiol 0.75 MG/1.25 GM (0.06%) topical gel Place 50 g onto the skin daily. (Patient not taking: Reported on 12/05/2020)  . [DISCONTINUED] ranitidine (ZANTAC) 150 MG capsule Take 150 mg by mouth as needed for heartburn. (Patient not taking: Reported on 12/05/2020)   No facility-administered encounter medications on file as of 12/05/2020.    Past Medical  History:  Diagnosis Date  . Depression   . Hiatal hernia   . Hypothyroidism     Past Surgical History:  Procedure Laterality Date  . CHOLECYSTECTOMY, LAPAROSCOPIC  1996  . TUBAL LIGATION  1990    Social History   Socioeconomic History  . Marital status: Married    Spouse name: Not on file  . Number of children: Not on file  . Years of education: Not on file  . Highest education level: Not on file  Occupational History  . Not on file  Tobacco Use  . Smoking status: Never Smoker  . Smokeless tobacco: Never Used  Vaping Use  . Vaping Use: Never used  Substance and Sexual Activity  . Alcohol use: Yes    Alcohol/week: 0.0 standard drinks    Comment: wine  . Drug use: Never  . Sexual activity: Yes    Birth control/protection: Surgical  Other Topics Concern  . Not on file  Social History Narrative  . Not on file   Social Determinants of Health   Financial Resource Strain: Not on file  Food Insecurity: Not on file  Transportation Needs: Not on file  Physical Activity: Not on file  Stress: Not on file  Social Connections: Not on file  Intimate Partner Violence: Not on file    Family History  Problem Relation Age of Onset  . Alzheimer's disease Brother   .  Alzheimer's disease Mother   . Hypothyroidism Mother   . Diabetes Mellitus I Father   . Stroke Father   . Diabetes Mellitus II Brother   . Hypothyroidism Sister   . Diabetes Mellitus II Sister   . Diabetes Sister   . Hyperthyroidism Sister        Objective: There were no vitals filed for this visit.   Physical Exam  Lab Results:  PSA No results found for: PSA No results found for: TESTOSTERONE  UA has 3-10 RBC's.  Results for orders placed or performed in visit on 12/05/20 (from the past 24 hour(s))  Urinalysis, Routine w reflex microscopic     Status: Abnormal   Collection Time: 12/05/20  2:47 PM  Result Value Ref Range   Specific Gravity, UA 1.025 1.005 - 1.030   pH, UA 6.0 5.0 - 7.5    Color, UA Yellow Yellow   Appearance Ur Clear Clear   Leukocytes,UA Negative Negative   Protein,UA Negative Negative/Trace   Glucose, UA Negative Negative   Ketones, UA Negative Negative   RBC, UA 2+ (A) Negative   Bilirubin, UA Negative Negative   Urobilinogen, Ur 0.2 0.2 - 1.0 mg/dL   Nitrite, UA Negative Negative   Microscopic Examination See below:    Narrative   Performed at:  [address] - [facility] [address], [place], [place]  [id number] Lab Director: Chinita Pester MT, Phone:  [phone]  Microscopic Examination     Status: Abnormal   Collection Time: 12/05/20  2:47 PM   Urine  Result Value Ref Range   WBC, UA 0-5 0 - 5 /hpf   RBC 3-10 (A) 0 - 2 /hpf   Epithelial Cells (non renal) 0-10 0 - 10 /hpf   Renal Epithel, UA None seen None seen /hpf   Bacteria, UA Few (A) None seen/Few   Narrative   Performed at:  [address] - [facility] [address], [place], [place]  [id number] Lab Director: Chinita Pester MT, Phone:  [phone]    Studies/Results: CLINICAL DATA: Hematuria, right flank pain.  EXAM: CT ABDOMEN AND PELVIS WITHOUT CONTRAST  TECHNIQUE: Multidetector CT imaging of the abdomen and pelvis was performed following the standard protocol without IV contrast.  COMPARISON: 01/18/2018.  FINDINGS: Lower chest: Lung bases are clear. Heart size normal. No pericardial or pleural effusion. Distal esophagus is grossly unremarkable.  Hepatobiliary: Liver may be slightly decreased in attenuation diffusely and measures approximately 19.3 cm. Cholecystectomy. No biliary ductal dilatation.  Pancreas: Negative.  Spleen: Negative.  Adrenals/Urinary Tract: Adrenal glands and kidneys are unremarkable. No urinary stones. Kidneys are unremarkable. Ureters are decompressed. Bladder is low in volume.  Stomach/Bowel: Stomach, small bowel and appendix are unremarkable. Stool is seen in the majority of the colon, indicative constipation.  Vascular/Lymphatic:  Mild atherosclerotic calcification of the aorta without aneurysm. No pathologically enlarged lymph nodes.  Reproductive: Uterus is visualized. No adnexal mass.  Other: No free fluid. Mesenteries and peritoneum are unremarkable.  Musculoskeletal: No worrisome lytic or sclerotic lesions. Degenerative changes in the spine.  IMPRESSION: 1. No urinary stones or obstruction. 2. Liver appears slightly steatotic and enlarged. 3. Aortic atherosclerosis (ICD10-I70.0).   Electronically Signed By: Leanna Battles M.D. On: 03/15/2020 11:54       Assessment & Plan: 1. History of recurrent UTI's.   She has some OAB symptoms and pelvic discomfort today.  Her UA has 3-10 RBC's and I will get a culture but I don't think she is currently infected.   2.  Microhematuria.   This is long standing and she had a negative CT in 7/21 but she has not had cystoscopy.   I will have her return for that procedure.  3.  Low back and upper leg discomfort.   She may have some post surgical musculoskeletal issues following the hysterectomy.  I suggested a PT referral but she would like to see if she has spontaneous improved.  4.  Vaginal atrophy.   I am going to get her back on estrogen cream.  I sent a script for premarin and will have her use 0.5gm nightly for 2 weeks and then use it 3x weekly.    Meds ordered this encounter  Medications  . conjugated estrogens (PREMARIN) vaginal cream    Sig: Place 1 Applicatorful vaginally at bedtime.    Dispense:  42.5 g    Refill:  12     Orders Placed This Encounter  Procedures  . Microscopic Examination  . Urine Culture  . Urinalysis, Routine w reflex microscopic      Return for Next available cystoscopy.   CC: Sasser, Clarene Critchley, MD      Bjorn Pippin 12/05/2020

## 2020-12-05 NOTE — Progress Notes (Signed)
Urological Symptom Review  Patient is experiencing the following symptoms: Frequent urination Hard to postpone urination Get up at night to urinate Leakage of urine Blood in urine Urinary tract infection   Review of Systems  Gastrointestinal (upper)  : Negative for upper GI symptoms  Gastrointestinal (lower) : Negative for lower GI symptoms  Constitutional : Night Sweats Fatigue  Skin: Negative for skin symptoms  Eyes: Negative for eye symptoms  Ear/Nose/Throat : Negative for Ear/Nose/Throat symptoms  Hematologic/Lymphatic: Swollen glands  Cardiovascular : Negative for cardiovascular symptoms  Respiratory : Negative for respiratory symptoms  Endocrine: Negative for endocrine symptoms  Musculoskeletal: Negative for musculoskeletal symptoms  Neurological: Negative for neurological symptoms  Psychologic: Negative for psychiatric symptoms

## 2020-12-08 LAB — URINE CULTURE

## 2020-12-09 ENCOUNTER — Telehealth: Payer: Self-pay

## 2020-12-09 NOTE — Telephone Encounter (Signed)
Left message on pts voicemail of culture results and no treatment needed.

## 2020-12-09 NOTE — Telephone Encounter (Signed)
-----   Message from Bjorn Pippin, MD sent at 12/09/2020  9:51 AM EDT ----- Mx species.  No treatment needed.

## 2021-02-13 ENCOUNTER — Other Ambulatory Visit: Payer: BC Managed Care – PPO | Admitting: Urology

## 2021-07-19 LAB — COLOGUARD: COLOGUARD: NEGATIVE

## 2022-10-15 ENCOUNTER — Encounter (HOSPITAL_COMMUNITY): Payer: Self-pay

## 2022-10-15 ENCOUNTER — Other Ambulatory Visit: Payer: Self-pay

## 2022-10-15 ENCOUNTER — Telehealth (INDEPENDENT_AMBULATORY_CARE_PROVIDER_SITE_OTHER): Payer: Self-pay | Admitting: *Deleted

## 2022-10-15 ENCOUNTER — Emergency Department (HOSPITAL_COMMUNITY)
Admission: EM | Admit: 2022-10-15 | Discharge: 2022-10-15 | Disposition: A | Discharge status: Home / Self Care | Payer: BC Managed Care – PPO | Attending: Emergency Medicine | Admitting: Emergency Medicine

## 2022-10-15 DIAGNOSIS — R1310 Dysphagia, unspecified: Secondary | ICD-10-CM | POA: Insufficient documentation

## 2022-10-15 DIAGNOSIS — R072 Precordial pain: Secondary | ICD-10-CM | POA: Insufficient documentation

## 2022-10-15 DIAGNOSIS — E039 Hypothyroidism, unspecified: Secondary | ICD-10-CM | POA: Insufficient documentation

## 2022-10-15 DIAGNOSIS — Z79899 Other long term (current) drug therapy: Secondary | ICD-10-CM | POA: Diagnosis not present

## 2022-10-15 DIAGNOSIS — R197 Diarrhea, unspecified: Secondary | ICD-10-CM | POA: Diagnosis present

## 2022-10-15 LAB — URINALYSIS, ROUTINE W REFLEX MICROSCOPIC
Bilirubin Urine: NEGATIVE
Glucose, UA: NEGATIVE mg/dL
Ketones, ur: NEGATIVE mg/dL
Nitrite: NEGATIVE
Protein, ur: NEGATIVE mg/dL
Specific Gravity, Urine: 1.026 (ref 1.005–1.030)
pH: 5 (ref 5.0–8.0)

## 2022-10-15 LAB — COMPREHENSIVE METABOLIC PANEL
ALT: 25 U/L (ref 0–44)
AST: 21 U/L (ref 15–41)
Albumin: 3.9 g/dL (ref 3.5–5.0)
Alkaline Phosphatase: 65 U/L (ref 38–126)
Anion gap: 5 (ref 5–15)
BUN: 23 mg/dL — ABNORMAL HIGH (ref 6–20)
CO2: 24 mmol/L (ref 22–32)
Calcium: 8.5 mg/dL — ABNORMAL LOW (ref 8.9–10.3)
Chloride: 107 mmol/L (ref 98–111)
Creatinine, Ser: 0.76 mg/dL (ref 0.44–1.00)
GFR, Estimated: >60 mL/min (ref >60)
Glucose, Bld: 95 mg/dL (ref 70–99)
Potassium: 3.7 mmol/L (ref 3.5–5.1)
Sodium: 136 mmol/L (ref 135–145)
Total Bilirubin: 1.3 mg/dL — ABNORMAL HIGH (ref 0.3–1.2)
Total Protein: 7.3 g/dL (ref 6.5–8.1)

## 2022-10-15 LAB — CBC
HCT: 39.4 % (ref 36.0–46.0)
Hemoglobin: 13.5 g/dL (ref 12.0–15.0)
MCH: 31.3 pg (ref 26.0–34.0)
MCHC: 34.3 g/dL (ref 30.0–36.0)
MCV: 91.2 fL (ref 80.0–100.0)
Platelets: 304 10*3/uL (ref 150–400)
RBC: 4.32 MIL/uL (ref 3.87–5.11)
RDW: 12.7 % (ref 11.5–15.5)
WBC: 6.9 10*3/uL (ref 4.0–10.5)
nRBC: 0 % (ref 0.0–0.2)

## 2022-10-15 LAB — TROPONIN I (HIGH SENSITIVITY): Troponin I (High Sensitivity): <2 ng/L (ref <18)

## 2022-10-15 LAB — LIPASE, BLOOD: Lipase: 35 U/L (ref 11–51)

## 2022-10-15 MED ORDER — ALUM & MAG HYDROXIDE-SIMETH 200-200-20 MG/5ML PO SUSP
30.0000 mL | Freq: Once | ORAL | Status: AC
  Administered 2022-10-15: 30 mL via ORAL
  Filled 2022-10-15: qty 30

## 2022-10-15 NOTE — ED Provider Notes (Signed)
[facility] Provider Note   CSN: [id number] Arrival date & time: 10/15/22  1141     History  Chief Complaint  Patient presents with   Diarrhea    Tanya Fuentes is a 62 y.o. female with a history including hypothyroidism and known hiatal hernia, surgical history significant for cholecystectomy, distant history of esophageal stricture with dilatation procedure approximately 10 years ago presenting with a 2-day history of difficulty swallowing her food.  She describes eating a cheeseburger last night and developed significant pressure in her lower substernal region which lasted for 1/2 the night before resolving.  Then this morning she ate a biscuit and continues to feel like it may be stuck in her throat.  However she has been able to tolerate her saliva, and is able to drink sips of water without regurgitation or vomiting.  With each sip however she is feels more and more full in her esophagus but eventually it resolves.  She describes midsternal chest pressure since her symptoms began.  She denies shortness of breath, diaphoresis, palpitations.  She also notes that she had diarrhea for 2 days last weekend, this resolved spontaneously without any medications.  She denies fevers or chills, no other complaints including shortness of breath, cough, wheezing.  The history is provided by the patient.       Home Medications Prior to Admission medications   Medication Sig Start Date End Date Taking? Authorizing Provider  FLUoxetine (PROZAC) 40 MG capsule Take 40 mg by mouth daily. 09/16/22  Yes [provider]  levothyroxine (SYNTHROID, LEVOTHROID) 100 MCG tablet Take 1 tablet by mouth daily. 12/13/15  Yes [provider]  metoprolol succinate (TOPROL-XL) 50 MG 24 hr tablet Take 50 mg by mouth daily.   Yes [provider]  pantoprazole (PROTONIX) 20 MG tablet Take 20 mg by mouth daily. Patient not taking: Reported on  10/15/2022    [provider]      Allergies    Compazine [prochlorperazine edisylate] and Penicillins    Review of Systems   Review of Systems  Constitutional:  Negative for fever.  HENT:  Positive for trouble swallowing. Negative for congestion and sore throat.   Eyes: Negative.   Respiratory:  Negative for chest tightness and shortness of breath.   Cardiovascular:  Negative for chest pain.  Gastrointestinal:  Negative for abdominal pain, nausea and vomiting.  Genitourinary: Negative.   Musculoskeletal:  Negative for arthralgias, joint swelling and neck pain.  Skin: Negative.  Negative for rash and wound.  Neurological:  Negative for dizziness, weakness, light-headedness, numbness and headaches.  Psychiatric/Behavioral: Negative.      Physical Exam Updated Vital Signs BP 117/60   Pulse 66   Temp 97.7 F (36.5 C) (Oral)   Resp 18   Ht '5\' 6"'$  (1.676 m)   Wt 99.8 kg   LMP 01/03/2011   SpO2 98%   BMI 35.51 kg/m  Physical Exam Vitals and nursing note reviewed.  Constitutional:      Appearance: She is well-developed.  HENT:     Head: Normocephalic and atraumatic.  Eyes:     Conjunctiva/sclera: Conjunctivae normal.  Cardiovascular:     Rate and Rhythm: Normal rate and regular rhythm.     Heart sounds: Normal heart sounds.  Pulmonary:     Effort: Pulmonary effort is normal.     Breath sounds: Normal breath sounds. No wheezing.  Abdominal:     General: Bowel sounds are normal.  Palpations: Abdomen is soft.     Tenderness: There is no abdominal tenderness.  Musculoskeletal:        General: Normal range of motion.     Cervical back: Normal range of motion.  Skin:    General: Skin is warm and dry.  Neurological:     Mental Status: She is alert.     ED Results / Procedures / Treatments   Labs (all labs ordered are listed, but only abnormal results are displayed) Labs Reviewed  COMPREHENSIVE METABOLIC PANEL - Abnormal; Notable for the following  components:      Result Value   BUN 23 (*)    Calcium 8.5 (*)    Total Bilirubin 1.3 (*)    All other components within normal limits  URINALYSIS, ROUTINE W REFLEX MICROSCOPIC - Abnormal; Notable for the following components:   APPearance TURBID (*)    Hgb urine dipstick SMALL (*)    Leukocytes,Ua SMALL (*)    Bacteria, UA MANY (*)    All other components within normal limits  LIPASE, BLOOD  CBC  TROPONIN I (HIGH SENSITIVITY)    EKG None ED ECG REPORT   Date: 10/16/2022  Rate: 63  Rhythm: normal sinus rhythm  QRS Axis: normal  Intervals: normal  ST/T Wave abnormalities: normal  Conduction Disutrbances:none  Narrative Interpretation: t wave changes seen on 01/09/2016 not present today  Old EKG Reviewed: changes noted  I have personally reviewed the EKG tracing and agree with the computerized printout as noted.  Radiology No results found.  Procedures Procedures    Medications Ordered in ED Medications  alum & mag hydroxide-simeth (MAALOX/MYLANTA) 200-200-20 MG/5ML suspension 30 mL (30 mLs Oral Given 10/15/22 1306)    ED Course/ Medical Decision Making/ A&P                             Medical Decision Making Patient presenting with dysphagia-like symptoms without clear full food impaction as she has had no vomiting or regurgitation.  However she has also had some midsternal chest pressure with this episode, ACS unlikely, troponin was collected to rule out.  Amount and/or Complexity of Data Reviewed Labs: ordered.    Details: Labs including a c-Met, lipase and CBC are reassuring.  She had a negative troponin.  Patient urinalysis had not been completed at the time of her discharge.  She had no urinary complaints.  Upon chart review, her urinalysis did show many bacteria and 25-50 WBCs, small amount of hemoglobin. Prescription for keflex sent,  pt advised.  Discussion of management or test interpretation with external provider(s): Patient discussed with Dr. Abbey Chatters  who will arrange endoscopy for tomorrow with this patient.  His scheduler had reached out to this patient for a 9 AM appointment in the morning prior to her discharge home.  Risk Decision regarding hospitalization. Risk Details: No indication for admission at this time.  She will return tomorrow for upper endoscopy with Dr. Abbey Chatters.  She was advised to avoid solid foods between now and then.  N.p.o. status per their recommendations.           Final Clinical Impression(s) / ED Diagnoses Final diagnoses:  Dysphagia, unspecified type    Rx / DC Orders ED Discharge Orders     None         Landis Martins 10/16/22 Ericka Pontiff, MD 10/16/22 [phone]

## 2022-10-15 NOTE — ED Provider Triage Note (Signed)
Emergency Medicine Provider Triage Evaluation Note  Tanya Fuentes , a 62 y.o. female  was evaluated in triage.  Pt complains of substernal chest pressure, excessive burping with foul smell, and pain with swallowing food.  Symptoms have been present for 1 week.  Became worse last evening after eating a cheeseburger.  She called her primary care provider and was advised to come here to see GI.  She has had previous stretching of her esophagus.  She does take PPI daily and has been on same dose for quite a while.  Review of Systems  Positive: Central chest pain, belching, dysphagia, diarrhea Negative: Fever, cough, abdominal pain, vomiting  Physical Exam  BP 122/71 (BP Location: Right Arm)   Pulse 65   Temp 97.7 F (36.5 C) (Oral)   Resp 16   Ht '5\' 6"'$  (1.676 m)   Wt 99.8 kg   LMP 01/03/2011   SpO2 100%   BMI 35.51 kg/m  Gen:   Awake, no distress   Resp:  Normal effort  MSK:   Moves extremities without difficulty  Other:  Central chest pain, belching  Medical Decision Making  Medically screening exam initiated at 12:49 PM.  Appropriate orders placed.  Tanya Fuentes was informed that the remainder of the evaluation will be completed by another provider, this initial triage assessment does not replace that evaluation, and the importance of remaining in the ED until their evaluation is complete.     Kem Parkinson, PA-C 10/15/22 1311

## 2022-10-15 NOTE — ED Notes (Signed)
Patient states that "while drinking water it hurts to swallow."

## 2022-10-15 NOTE — ED Triage Notes (Signed)
Pt reports she has been having diarrhea and egg burps for the past week and this morning she ate 3 bites of a biscuit and feels like it is stuck in her esophagus.  She called her PCP and he told her to come to the ER because she needs to see a gastroenterologist and we have one here.

## 2022-10-15 NOTE — Telephone Encounter (Signed)
Received call from Dr. Abbey Chatters, pt in ED and needs EGD/DIL tomorrow if possible.   Called pt and she has been scheduled. Aware to arrival at 9am at [facility] main entrance. Discussed EGD instructions in detail with her.

## 2022-10-16 ENCOUNTER — Ambulatory Visit (HOSPITAL_COMMUNITY): Payer: BC Managed Care – PPO | Admitting: Certified Registered Nurse Anesthetist

## 2022-10-16 ENCOUNTER — Ambulatory Visit (HOSPITAL_COMMUNITY)
Admission: RE | Admit: 2022-10-16 | Discharge: 2022-10-16 | Disposition: A | Discharge status: Home / Self Care | Payer: BC Managed Care – PPO | Source: Ambulatory Visit | Attending: Internal Medicine | Admitting: Internal Medicine

## 2022-10-16 ENCOUNTER — Encounter (HOSPITAL_COMMUNITY): Payer: Self-pay

## 2022-10-16 ENCOUNTER — Other Ambulatory Visit: Payer: Self-pay

## 2022-10-16 ENCOUNTER — Encounter (HOSPITAL_COMMUNITY)
Admission: RE | Disposition: A | Discharge status: Home / Self Care | Payer: Self-pay | Source: Ambulatory Visit | Attending: Internal Medicine

## 2022-10-16 DIAGNOSIS — K219 Gastro-esophageal reflux disease without esophagitis: Secondary | ICD-10-CM | POA: Diagnosis not present

## 2022-10-16 DIAGNOSIS — R1310 Dysphagia, unspecified: Secondary | ICD-10-CM | POA: Insufficient documentation

## 2022-10-16 DIAGNOSIS — K2970 Gastritis, unspecified, without bleeding: Secondary | ICD-10-CM

## 2022-10-16 DIAGNOSIS — E039 Hypothyroidism, unspecified: Secondary | ICD-10-CM | POA: Diagnosis not present

## 2022-10-16 DIAGNOSIS — K449 Diaphragmatic hernia without obstruction or gangrene: Secondary | ICD-10-CM | POA: Insufficient documentation

## 2022-10-16 DIAGNOSIS — K222 Esophageal obstruction: Secondary | ICD-10-CM | POA: Insufficient documentation

## 2022-10-16 DIAGNOSIS — F32A Depression, unspecified: Secondary | ICD-10-CM | POA: Insufficient documentation

## 2022-10-16 DIAGNOSIS — I10 Essential (primary) hypertension: Secondary | ICD-10-CM | POA: Insufficient documentation

## 2022-10-16 DIAGNOSIS — G709 Myoneural disorder, unspecified: Secondary | ICD-10-CM | POA: Diagnosis not present

## 2022-10-16 DIAGNOSIS — R12 Heartburn: Secondary | ICD-10-CM | POA: Insufficient documentation

## 2022-10-16 DIAGNOSIS — Z79890 Hormone replacement therapy (postmenopausal): Secondary | ICD-10-CM | POA: Diagnosis not present

## 2022-10-16 DIAGNOSIS — Z79899 Other long term (current) drug therapy: Secondary | ICD-10-CM | POA: Insufficient documentation

## 2022-10-16 DIAGNOSIS — K2950 Unspecified chronic gastritis without bleeding: Secondary | ICD-10-CM | POA: Insufficient documentation

## 2022-10-16 DIAGNOSIS — K317 Polyp of stomach and duodenum: Secondary | ICD-10-CM | POA: Diagnosis not present

## 2022-10-16 HISTORY — DX: Other specified postprocedural states: R11.2

## 2022-10-16 HISTORY — PX: BIOPSY: SHX5522

## 2022-10-16 HISTORY — PX: BALLOON DILATION: SHX5330

## 2022-10-16 HISTORY — PX: ESOPHAGOGASTRODUODENOSCOPY (EGD) WITH PROPOFOL: SHX5813

## 2022-10-16 HISTORY — DX: Other specified postprocedural states: Z98.890

## 2022-10-16 SURGERY — ESOPHAGOGASTRODUODENOSCOPY (EGD) WITH PROPOFOL
Anesthesia: General

## 2022-10-16 MED ORDER — LIDOCAINE HCL (CARDIAC) PF 100 MG/5ML IV SOSY
PREFILLED_SYRINGE | INTRAVENOUS | Status: DC | PRN
  Administered 2022-10-16: 50 mg via INTRAVENOUS

## 2022-10-16 MED ORDER — CEPHALEXIN 500 MG PO CAPS: 500.0000 mg | ORAL_CAPSULE | Freq: Two times a day (BID) | ORAL | 0 refills | Status: AC

## 2022-10-16 MED ORDER — PROPOFOL 10 MG/ML IV BOLUS
INTRAVENOUS | Status: DC | PRN
  Administered 2022-10-16: 50 mg via INTRAVENOUS
  Administered 2022-10-16: 100 mg via INTRAVENOUS
  Administered 2022-10-16 (×2): 50 mg via INTRAVENOUS

## 2022-10-16 MED ORDER — PANTOPRAZOLE SODIUM 40 MG PO TBEC: 40.0000 mg | DELAYED_RELEASE_TABLET | Freq: Two times a day (BID) | ORAL | 11 refills | Status: AC

## 2022-10-16 MED ORDER — LACTATED RINGERS IV SOLN
INTRAVENOUS | Status: DC
  Administered 2022-10-16: 1000 mL via INTRAVENOUS

## 2022-10-16 NOTE — Transfer of Care (Deleted)
Immediate Anesthesia Transfer of Care Note  Patient: Tanya Fuentes  Procedure(s) Performed: ESOPHAGOGASTRODUODENOSCOPY (EGD) WITH PROPOFOL BALLOON DILATION  Patient Location: Endoscopy Unit  Anesthesia Type:General  Level of Consciousness: drowsy and patient cooperative  Airway & Oxygen Therapy: Patient Spontanous Breathing  Post-op Assessment: Report given to RN and Post -op Vital signs reviewed and stable  Post vital signs: Reviewed and stable  Last Vitals:  Vitals Value Taken Time  BP 101/49 1024  Temp 97.6 1024  Pulse 68 1024  Resp 12 1024  SpO2 99 1024    Last Pain:  Vitals:   10/16/22 0929  TempSrc: Oral  PainSc: 0-No pain      Patients Stated Pain Goal: 6 ([id number])  Complications: No notable events documented.

## 2022-10-16 NOTE — H&P (Signed)
Primary Care Physician:  Manon Hilding, MD Primary Gastroenterologist:  Dr. Abbey Chatters  Pre-Procedure History & Physical: HPI:  Tanya Fuentes is a 62 y.o. female is here for an EGD with possible dilation to be performed for GERD, Dysphagia, epigastric pain  Past Medical History:  Diagnosis Date   Depression    Hiatal hernia    Hypothyroidism    PONV (postoperative nausea and vomiting)     Past Surgical History:  Procedure Laterality Date   ABDOMINAL HYSTERECTOMY  2021   CHOLECYSTECTOMY, LAPAROSCOPIC  08/24/1994   TUBAL LIGATION  08/24/1988    Prior to Admission medications   Medication Sig Start Date End Date Taking? Authorizing Provider  FLUoxetine (PROZAC) 40 MG capsule Take 40 mg by mouth daily. 09/16/22  Yes [provider]  levothyroxine (SYNTHROID, LEVOTHROID) 100 MCG tablet Take 1 tablet by mouth daily. 12/13/15  Yes [provider]  metoprolol succinate (TOPROL-XL) 50 MG 24 hr tablet Take 50 mg by mouth daily.   Yes [provider]  pantoprazole (PROTONIX) 20 MG tablet Take 20 mg by mouth daily.   Yes [provider]  cephALEXin (KEFLEX) 500 MG capsule Take 1 capsule (500 mg total) by mouth 2 (two) times daily for 7 days. 10/16/22 10/23/22  Evalee Jefferson, PA-C    Allergies as of 10/15/2022 - Review Complete 10/15/2022  Allergen Reaction Noted   Compazine [prochlorperazine edisylate] Other (See Comments)    Penicillins Rash     Family History  Problem Relation Age of Onset   Alzheimer's disease Brother    Alzheimer's disease Mother    Hypothyroidism Mother    Diabetes Mellitus I Father    Stroke Father    Diabetes Mellitus II Brother    Hypothyroidism Sister    Diabetes Mellitus II Sister    Diabetes Sister    Hyperthyroidism Sister     Social History   Socioeconomic History   Marital status: Married    Spouse name: Not on file   Number of children: Not on file   Years of education: Not on file   Highest education level:  Not on file  Occupational History   Not on file  Tobacco Use   Smoking status: Never   Smokeless tobacco: Never  Vaping Use   Vaping Use: Never used  Substance and Sexual Activity   Alcohol use: Yes    Comment: wine- occasionally   Drug use: Never   Sexual activity: Yes    Birth control/protection: Surgical  Other Topics Concern   Not on file  Social History Narrative   Not on file   Social Determinants of Health   Financial Resource Strain: Not on file  Food Insecurity: Not on file  Transportation Needs: Not on file  Physical Activity: Not on file  Stress: Not on file  Social Connections: Not on file  Intimate Partner Violence: Not on file    Review of Systems: General: Negative for fever, chills, fatigue, weakness. Eyes: Negative for vision changes.  ENT: Negative for hoarseness, difficulty swallowing , nasal congestion. CV: Negative for chest pain, angina, palpitations, dyspnea on exertion, peripheral edema.  Respiratory: Negative for dyspnea at rest, dyspnea on exertion, cough, sputum, wheezing.  GI: See history of present illness. GU:  Negative for dysuria, hematuria, urinary incontinence, urinary frequency, nocturnal urination.  MS: Negative for joint pain, low back pain.  Derm: Negative for rash or itching.  Neuro: Negative for weakness, abnormal sensation, seizure, frequent headaches, memory loss, confusion.  Psych: Negative  for anxiety, depression Endo: Negative for unusual weight change.  Heme: Negative for bruising or bleeding. Allergy: Negative for rash or hives.  Physical Exam: Vital signs in last 24 hours: Temp:  [97.7 F (36.5 C)] 97.7 F (36.5 C) (02/22 1627) Pulse Rate:  [60-66] 66 (02/22 1600) Resp:  [16-21] 18 (02/22 1600) BP: (107-122)/(58-71) 117/60 (02/22 1600) SpO2:  [98 %-100 %] 98 % (02/22 1600) Weight:  [99.8 kg] 99.8 kg (02/22 1200)   General:   Alert,  Well-developed, well-nourished, pleasant and cooperative in NAD Head:   Normocephalic and atraumatic. Eyes:  Sclera clear, no icterus.   Conjunctiva pink. Ears:  Normal auditory acuity. Nose:  No deformity, discharge,  or lesions. Msk:  Symmetrical without gross deformities. Normal posture. Extremities:  Without clubbing or edema. Neurologic:  Alert and  oriented x4;  grossly normal neurologically. Skin:  Intact without significant lesions or rashes. Psych:  Alert and cooperative. Normal mood and affect.   Impression/Plan: Tanya Fuentes is here for an EGD with possible dilation to be performed for GERD, Dysphagia, epigastric pain  Risks, benefits, limitations, imponderables and alternatives regarding EGD have been reviewed with the patient. Questions have been answered. All parties agreeable.

## 2022-10-16 NOTE — Discharge Instructions (Addendum)
EGD Discharge instructions Please read the instructions outlined below and refer to this sheet in the next few weeks. These discharge instructions provide you with general information on caring for yourself after you leave the hospital. Your doctor may also give you specific instructions. While your treatment has been planned according to the most current medical practices available, unavoidable complications occasionally occur. If you have any problems or questions after discharge, please call your doctor. ACTIVITY You may resume your regular activity but move at a slower pace for the next 24 hours.  Take frequent rest periods for the next 24 hours.  Walking will help expel (get rid of) the air and reduce the bloated feeling in your abdomen.  No driving for 24 hours (because of the anesthesia (medicine) used during the test).  You may shower.  Do not sign any important legal documents or operate any machinery for 24 hours (because of the anesthesia used during the test).  NUTRITION Drink plenty of fluids.  You may resume your normal diet.  Begin with a light meal and progress to your normal diet.  Avoid alcoholic beverages for 24 hours or as instructed by your caregiver.  MEDICATIONS You may resume your normal medications unless your caregiver tells you otherwise.  WHAT YOU CAN EXPECT TODAY You may experience abdominal discomfort such as a feeling of fullness or "gas" pains.  FOLLOW-UP Your doctor will discuss the results of your test with you.  SEEK IMMEDIATE MEDICAL ATTENTION IF ANY OF THE FOLLOWING OCCUR: Excessive nausea (feeling sick to your stomach) and/or vomiting.  Severe abdominal pain and distention (swelling).  Trouble swallowing.  Temperature over 101 F (37.8 C).  Rectal bleeding or vomiting of blood.   Your upper endoscopy revealed a small hiatal hernia.  You also had a stricture in your esophagus called a Schatzki's ring.  This is related to chronic acid reflux.  I  stretched this out today.  Hopefully this helps with your swallowing.  Mild amount inflammation in your stomach which also sampled to rule out infection with a bacteria called H. pylori.  Numerous small polyps in your stomach likely related to PPI therapy.  These are almost always benign, nothing to worry about.  I did remove 3 to sample them today.  We will call with results next week.  Small bowel was normal.  I am going to increase your pantoprazole to 40 mg twice daily for the next 12 weeks at which point you can decrease down to once daily.  Follow-up in GI clinic in 3 months.   I hope you have a great rest of your week!  Elon Alas. Abbey Chatters, D.O. Gastroenterology and Hepatology [facility]

## 2022-10-16 NOTE — Anesthesia Preprocedure Evaluation (Addendum)
Anesthesia Evaluation  Patient identified by MRN, date of birth, ID band Patient awake    Reviewed: Allergy & Precautions, H&P , NPO status , Patient's Chart, lab work & pertinent test results, reviewed documented beta blocker date and time   History of Anesthesia Complications (+) PONV and history of anesthetic complications  Airway Mallampati: II  TM Distance: >3 FB Neck ROM: Full    Dental  (+) Dental Advisory Given, Missing   Pulmonary neg pulmonary ROS   Pulmonary exam normal breath sounds clear to auscultation       Cardiovascular hypertension, Pt. on medications and Pt. on home beta blockers Normal cardiovascular exam Rhythm:Regular Rate:Normal     Neuro/Psych  PSYCHIATRIC DISORDERS  Depression     Neuromuscular disease    GI/Hepatic Neg liver ROS, hiatal hernia,GERD  Medicated and Controlled,,  Endo/Other  Hypothyroidism    Renal/GU negative Renal ROS  negative genitourinary   Musculoskeletal negative musculoskeletal ROS (+)    Abdominal   Peds negative pediatric ROS (+)  Hematology negative hematology ROS (+)   Anesthesia Other Findings   Reproductive/Obstetrics negative OB ROS                             Anesthesia Physical Anesthesia Plan  ASA: 2  Anesthesia Plan: General   Post-op Pain Management: Minimal or no pain anticipated   Induction: Intravenous  PONV Risk Score and Plan: 1 and Propofol infusion  Airway Management Planned: Nasal Cannula and Natural Airway  Additional Equipment:   Intra-op Plan:   Post-operative Plan:   Informed Consent: I have reviewed the patients History and Physical, chart, labs and discussed the procedure including the risks, benefits and alternatives for the proposed anesthesia with the patient or authorized representative who has indicated his/her understanding and acceptance.     Dental advisory given  Plan Discussed with:  CRNA and Surgeon  Anesthesia Plan Comments:        Anesthesia Quick Evaluation

## 2022-10-16 NOTE — Anesthesia Postprocedure Evaluation (Signed)
Anesthesia Post Note  Patient: Tanya Fuentes  Procedure(s) Performed: ESOPHAGOGASTRODUODENOSCOPY (EGD) WITH PROPOFOL BALLOON DILATION BIOPSY  Patient location during evaluation: Phase II Anesthesia Type: General Level of consciousness: awake and alert and oriented Pain management: pain level controlled Vital Signs Assessment: post-procedure vital signs reviewed and stable Respiratory status: spontaneous breathing, nonlabored ventilation and respiratory function stable Cardiovascular status: blood pressure returned to baseline and stable Postop Assessment: no apparent nausea or vomiting Anesthetic complications: no  No notable events documented.   Last Vitals:  Vitals:   10/16/22 0929 10/16/22 1048  BP: 112/68 (!) 122/51  Pulse: (!) 57 75  Resp: 16 15  Temp: 36.7 C 36.5 C  SpO2: 97% 95%    Last Pain:  Vitals:   10/16/22 1048  TempSrc: Oral  PainSc: 0-No pain                 Tyjae Shvartsman C Kiyana Vazguez

## 2022-10-16 NOTE — Op Note (Signed)
[facility] Patient Name: Tanya Fuentes Procedure Date: 10/16/2022 10:24 AM MRN: [id number] Date of Birth: 1961-02-24 Attending MD: Elon Alas. Abbey Chatters , [place], [id number] CSN: [id number] Age: 62 Admit Type: Outpatient Procedure:                Upper GI endoscopy Indications:              Dysphagia, Heartburn Providers:                Elon Alas. Abbey Chatters, DO, Janeece Riggers, RN, Kristine L.                            Risa Grill, Technician Referring MD:              Medicines:                See the Anesthesia note for documentation of the                            administered medications Complications:            No immediate complications. Estimated Blood Loss:     Estimated blood loss was minimal. Procedure:                Pre-Anesthesia Assessment:                           - The anesthesia plan was to use monitored                            anesthesia care (MAC).                           After obtaining informed consent, the endoscope was                            passed under direct vision. Throughout the                            procedure, the patient's blood pressure, pulse, and                            oxygen saturations were monitored continuously. The                            [id number]-[id number]) scope was introduced through the                            mouth, and advanced to the second part of duodenum.                            The upper GI endoscopy was accomplished without                            difficulty. The patient tolerated the procedure                            well. Scope In:  10:35:00 AM Scope Out: 10:43:39 AM Total Procedure Duration: 0 hours 8 minutes 39 seconds  Findings:      A 2 cm hiatal hernia was present.      A mild Schatzki ring was found in the distal esophagus. A TTS dilator       was passed through the scope. Dilation with an 18-19-20 mm balloon       dilator was performed to 20 mm. The dilation site was examined and       showed  mild mucosal disruption and moderate improvement in luminal       narrowing.      Patchy mild inflammation characterized by erythema was found in the       gastric body and in the gastric antrum. Biopsies were taken with a cold       forceps for Helicobacter pylori testing.      Multiple small to medium sessile polyps with no bleeding and no stigmata       of recent bleeding were found in the gastric fundus and in the gastric       body. One was >1cm removed with a cold snare. Two others on the bigger       side compared to rest also removed though both <1 cm. Resection and       retrieval were complete with Jabier Mutton net.      The duodenal bulb, first portion of the duodenum and second portion of       the duodenum were normal. Impression:               - 2 cm hiatal hernia.                           - Mild Schatzki ring. Dilated.                           - Gastritis. Biopsied.                           - Multiple gastric polyps. Resected and retrieved.                           - Normal duodenal bulb, first portion of the                            duodenum and second portion of the duodenum. Moderate Sedation:      Per Anesthesia Care Recommendation:           - Patient has a contact number available for                            emergencies. The signs and symptoms of potential                            delayed complications were discussed with the                            patient. Return to normal activities tomorrow.  Written discharge instructions were provided to the                            patient.                           - Resume previous diet.                           - Continue present medications.                           - Await pathology results.                           - Repeat upper endoscopy PRN for retreatment.                           - Use Protonix (pantoprazole) 40 mg PO BID.                           - Return to GI clinic in 3  months. Procedure Code(s):        --- Professional ---                           [phone], Esophagogastroduodenoscopy, flexible,                            transoral; with removal of tumor(s), polyp(s), or                            other lesion(s) by snare technique                           43249, Esophagogastroduodenoscopy, flexible,                            transoral; with transendoscopic balloon dilation of                            esophagus (less than 30 mm diameter)                           43239, 59, Esophagogastroduodenoscopy, flexible,                            transoral; with biopsy, single or multiple Diagnosis Code(s):        --- Professional ---                           K44.9, Diaphragmatic hernia without obstruction or                            gangrene                           K22.2, Esophageal obstruction  K29.70, Gastritis, unspecified, without bleeding                           K31.7, Polyp of stomach and duodenum                           R13.10, Dysphagia, unspecified                           R12, Heartburn CPT copyright 2022 American [employer] Association. All rights reserved. The codes documented in this report are preliminary and upon coder review may  be revised to meet current compliance requirements. Elon Alas. Abbey Chatters, DO [place] Abbey Chatters, DO 10/16/2022 10:49:42 AM This report has been signed electronically. Number of Addenda: 0

## 2022-10-16 NOTE — Transfer of Care (Signed)
Immediate Anesthesia Transfer of Care Note  Patient: Tanya Fuentes  Procedure(s) Performed: ESOPHAGOGASTRODUODENOSCOPY (EGD) WITH PROPOFOL BALLOON DILATION BIOPSY  Patient Location: Endoscopy Unit  Anesthesia Type:General  Level of Consciousness: awake, alert , and oriented  Airway & Oxygen Therapy: Patient Spontanous Breathing  Post-op Assessment: Report given to RN and Post -op Vital signs reviewed and stable  Post vital signs: Reviewed and stable  Last Vitals:  Vitals Value Taken Time  BP 122/51 10/16/22 1048  Temp 36.5 C 10/16/22 1048  Pulse 75 10/16/22 1048  Resp 15 10/16/22 1048  SpO2 95 % 10/16/22 1048    Last Pain:  Vitals:   10/16/22 1048  TempSrc: Oral  PainSc: 0-No pain      Patients Stated Pain Goal: 6 ([id number])  Complications: No notable events documented.

## 2022-10-19 LAB — SURGICAL PATHOLOGY

## 2022-10-20 ENCOUNTER — Telehealth (INDEPENDENT_AMBULATORY_CARE_PROVIDER_SITE_OTHER): Payer: Self-pay | Admitting: *Deleted

## 2022-10-20 ENCOUNTER — Telehealth: Payer: Self-pay | Admitting: *Deleted

## 2022-10-20 NOTE — Telephone Encounter (Signed)
Received refill request for Pantoprazole '40mg'$ 

## 2022-10-20 NOTE — Telephone Encounter (Signed)
I called patient to see why she requested med since a year supply had been sent in on 10/16/22. She said she did not request it and she has med and has not had any issues getting med. She then wanted to know when is she suppose to follow up in office with Dr. Abbey Chatters. She could not remember if he said one month or 3 months and wanted to know if her biopies were back yet.

## 2022-10-20 NOTE — Telephone Encounter (Signed)
error 

## 2022-10-21 NOTE — Telephone Encounter (Signed)
Please let patient know with phone call or letter that the biopsies showed inflammation in the stomach, negative for H. pylori.  Continue on twice daily PPI.  Avoid NSAIDs.     Follow-up with GI in 3 months

## 2022-10-22 NOTE — Telephone Encounter (Signed)
Patient wanted to go ahead and schedule her 3 month follow up with dr Abbey Chatters. She said just mail her an appt because she is out of town currently.

## 2022-10-22 NOTE — Telephone Encounter (Signed)
Discussed with patient per Dr. Abbey Chatters -  Please let patient know with phone call or letter that the biopsies showed inflammation in the stomach, negative for H. pylori.  Continue on twice daily PPI.  Avoid NSAIDs.       Follow-up with GI in 3 months    Patient verbalized understanding.

## 2022-10-22 NOTE — Telephone Encounter (Signed)
Left message to return call.  

## 2022-10-26 ENCOUNTER — Encounter (HOSPITAL_COMMUNITY): Payer: Self-pay | Admitting: Internal Medicine

## 2022-11-12 ENCOUNTER — Telehealth: Payer: Self-pay

## 2022-11-12 NOTE — Telephone Encounter (Signed)
PA done for pantoprazole 40mg . Tried/falied: Pantoprazole 20 mg and Ranitidine 150 mg. Dx used: R13.1 (Dysphagia)

## 2023-01-06 ENCOUNTER — Ambulatory Visit (INDEPENDENT_AMBULATORY_CARE_PROVIDER_SITE_OTHER): Payer: BC Managed Care – PPO | Admitting: Internal Medicine

## 2023-01-06 ENCOUNTER — Encounter: Payer: Self-pay | Admitting: Internal Medicine

## 2023-01-06 VITALS — BP 124/71 | HR 74 | Temp 98.1°F | Ht 66.0 in | Wt 229.0 lb

## 2023-01-06 DIAGNOSIS — Z1211 Encounter for screening for malignant neoplasm of colon: Secondary | ICD-10-CM | POA: Diagnosis not present

## 2023-01-06 DIAGNOSIS — K219 Gastro-esophageal reflux disease without esophagitis: Secondary | ICD-10-CM | POA: Diagnosis not present

## 2023-01-06 DIAGNOSIS — R1319 Other dysphagia: Secondary | ICD-10-CM | POA: Diagnosis not present

## 2023-01-06 DIAGNOSIS — K222 Esophageal obstruction: Secondary | ICD-10-CM | POA: Diagnosis not present

## 2023-01-06 NOTE — Progress Notes (Signed)
Referring Provider: Estanislado Pandy, MD Primary Care Physician:  Estanislado Pandy, MD Primary GI:  Dr. Marletta Lor  Chief Complaint  Patient presents with   post procedure follow up    Patient states doing better since procedure. Did have an episode a couple weeks ago where a piece of toast got hung and took a couple of hours to go down. Would like to discuss results of egd.     HPI:   Tanya Fuentes is a 62 y.o. female who presents the clinic today for follow-up visit.  EGD 10/16/2022 for dysphagia and previous resolved food impaction showed 2 cm hiatal hernia, Schatzki's ring distal esophagus dilated with 20 mm balloon, gastritis biopsies negative for H. pylori, multiple gastric polyps (fundic gland polyps on pathology), normal duodenum.  Started on pantoprazole 40 mg twice daily.  Last colonoscopy 10/22/2008 unremarkable.  Denies any family history of colorectal malignancy.  No melena hematochezia.  No unintentional weight loss or abdominal pain.  Cologuard testing negative 07/14/21.  Today, states she is doing better.  Reflux well-controlled.  1 episode of bread getting stuck in her substernal region which eventually resolved.  Otherwise no other complaints.  Past Medical History:  Diagnosis Date   Depression    Hiatal hernia    Hypothyroidism    PONV (postoperative nausea and vomiting)     Past Surgical History:  Procedure Laterality Date   ABDOMINAL HYSTERECTOMY  2021   BALLOON DILATION N/A 10/16/2022   Procedure: BALLOON DILATION;  Surgeon: Lanelle Bal, DO;  [facility];  Service: Endoscopy;  Laterality: N/A;   BIOPSY  10/16/2022   Procedure: BIOPSY;  Surgeon: Lanelle Bal, DO;  [facility];  Service: Endoscopy;;   CHOLECYSTECTOMY, LAPAROSCOPIC  08/24/1994   ESOPHAGOGASTRODUODENOSCOPY (EGD) WITH PROPOFOL N/A 10/16/2022   Procedure: ESOPHAGOGASTRODUODENOSCOPY (EGD) WITH PROPOFOL;  Surgeon: Lanelle Bal, DO;  [facility]: [facility];   Service: Endoscopy;  Laterality: N/A;  1030am, asa 2   TUBAL LIGATION  08/24/1988    Current Outpatient Medications  Medication Sig Dispense Refill   FLUoxetine (PROZAC) 40 MG capsule Take 40 mg by mouth daily.     levothyroxine (SYNTHROID, LEVOTHROID) 100 MCG tablet Take 1 tablet by mouth daily.  0   metoprolol succinate (TOPROL-XL) 50 MG 24 hr tablet Take 25 mg by mouth. Takes one half daily     pantoprazole (PROTONIX) 40 MG tablet Take 1 tablet (40 mg total) by mouth 2 (two) times daily. 60 tablet 11   No current facility-administered medications for this visit.    Allergies as of 01/06/2023 - Review Complete 01/06/2023  Allergen Reaction Noted   Compazine [prochlorperazine edisylate] Other (See Comments)    Penicillins Rash     Family History  Problem Relation Age of Onset   Alzheimer's disease Brother    Alzheimer's disease Mother    Hypothyroidism Mother    Diabetes Mellitus I Father    Stroke Father    Diabetes Mellitus II Brother    Hypothyroidism Sister    Diabetes Mellitus II Sister    Diabetes Sister    Hyperthyroidism Sister     Social History   Socioeconomic History   Marital status: Married    Spouse name: Not on file   Number of children: Not on file   Years of education: Not on file   Highest education level: Not on file  Occupational History   Not on file  Tobacco Use   Smoking  status: Never    Passive exposure: Never   Smokeless tobacco: Never  Vaping Use   Vaping Use: Never used  Substance and Sexual Activity   Alcohol use: Yes    Comment: wine- occasionally   Drug use: Never   Sexual activity: Yes    Birth control/protection: Surgical  Other Topics Concern   Not on file  Social History Narrative   Not on file   Social Determinants of Health   Financial Resource Strain: Not on file  Food Insecurity: Not on file  Transportation Needs: Not on file  Physical Activity: Not on file  Stress: Not on file  Social Connections: Not on file     Subjective: Review of Systems  Constitutional:  Negative for chills and fever.  HENT:  Negative for congestion and hearing loss.   Eyes:  Negative for blurred vision and double vision.  Respiratory:  Negative for cough and shortness of breath.   Cardiovascular:  Negative for chest pain and palpitations.  Gastrointestinal:  Negative for abdominal pain, blood in stool, constipation, diarrhea, heartburn, melena and vomiting.  Genitourinary:  Negative for dysuria and urgency.  Musculoskeletal:  Negative for joint pain and myalgias.  Skin:  Negative for itching and rash.  Neurological:  Negative for dizziness and headaches.  Psychiatric/Behavioral:  Negative for depression. The patient is not nervous/anxious.      Objective: BP 124/71 (BP Location: Left Arm, Patient Position: Sitting, Cuff Size: Large)   Pulse 74   Temp 98.1 F (36.7 C) (Oral)   Ht 5\' 6"  (1.676 m)   Wt 229 lb (103.9 kg)   LMP 01/03/2011   BMI 36.96 kg/m  Physical Exam Constitutional:      Appearance: Normal appearance.  HENT:     Head: Normocephalic and atraumatic.  Eyes:     Extraocular Movements: Extraocular movements intact.     Conjunctiva/sclera: Conjunctivae normal.  Cardiovascular:     Rate and Rhythm: Normal rate and regular rhythm.  Pulmonary:     Effort: Pulmonary effort is normal.     Breath sounds: Normal breath sounds.  Abdominal:     General: Bowel sounds are normal.     Palpations: Abdomen is soft.  Musculoskeletal:        General: No swelling. Normal range of motion.     Cervical back: Normal range of motion and neck supple.  Skin:    General: Skin is warm and dry.     Coloration: Skin is not jaundiced.  Neurological:     General: No focal deficit present.     Mental Status: She is alert and oriented to person, place, and time.  Psychiatric:        Mood and Affect: Mood normal.        Behavior: Behavior normal.      Assessment: *Chronic GERD-well-controlled *Esophageal  dysphagia/Schatzki's ring-improved status post dilation *Colon cancer screening  Plan: Patient doing better on pantoprazole twice daily.  Decrease down to once daily and see how she tolerates.  Home practices to decrease reflux symptoms printed out for patient today.  Esophageal dysphagia improved status post dilation.  Reviewed her EGD results with her today.  May need future dilations though doing well currently.  Patient to avoid tough textures. All meats should be chopped finely. Eat slowly, take small bites, chew thoroughly, and drink plenty of liquids throughout meals. Advised if something were to get hung in patient's esophagus and not come up or go down, they should proceed to the emergency  room.  Repeat Cologuard November 2025.  Patient average risk.  Follow-up in 1 year or sooner if needed.  01/06/2023 10:16 AM   Disclaimer: This note was dictated with voice recognition software. Similar sounding words can inadvertently be transcribed and may not be corrected upon review.

## 2023-01-06 NOTE — Patient Instructions (Addendum)
I am happy to hear that you are doing better.  Continue on pantoprazole, you can decrease down to once daily and see how you do.  Recommend you avoid tough textures. All meats should be chopped finely. Eat slowly, take small bites, chew thoroughly, and drink plenty of liquids throughout meals. If something were to get hung in your esophagus and not come up or go down, you should proceed to the emergency room.  You will be due for repeat Cologuard testing November 2025.  Follow-up in 1 year or sooner if needed.  It was very nice seeing you again today.  Dr. Marletta Lor  Lifestyle and home remedies TO MANAGE REFLUX/HEARTBURN    You may eliminate or reduce the frequency of heartburn by making the following lifestyle changes:   Control your weight. Being overweight is a major risk factor for heartburn and GERD. Excess pounds put pressure on your abdomen, pushing up your stomach and causing acid to back up into your esophagus.    Eat smaller meals. 4 TO 6 MEALS A DAY. This reduces pressure on the lower esophageal sphincter, helping to prevent the valve from opening and acid from washing back into your esophagus.     Loosen your belt. Clothes that fit tightly around your waist put pressure on your abdomen and the lower esophageal sphincter.     Eliminate heartburn triggers. Everyone has specific triggers. Common triggers such as fatty or fried foods, spicy food, tomato sauce, carbonated beverages, alcohol, chocolate, mint, garlic, onion, caffeine and nicotine may make heartburn worse.    Avoid stooping or bending. Tying your shoes is OK. Bending over for longer periods to weed your garden isn't, especially soon after eating.    Don't lie down after a meal. Wait at least three to four hours after eating before going to bed, and don't lie down right after eating.

## 2024-01-06 ENCOUNTER — Ambulatory Visit: Payer: BC Managed Care – PPO | Admitting: Internal Medicine

## 2024-02-09 ENCOUNTER — Encounter: Payer: Self-pay | Admitting: Obstetrics and Gynecology

## 2024-02-09 ENCOUNTER — Ambulatory Visit: Admitting: Obstetrics and Gynecology

## 2024-02-09 VITALS — BP 134/84 | HR 77

## 2024-02-09 DIAGNOSIS — E039 Hypothyroidism, unspecified: Secondary | ICD-10-CM

## 2024-02-09 DIAGNOSIS — R7989 Other specified abnormal findings of blood chemistry: Secondary | ICD-10-CM

## 2024-02-09 DIAGNOSIS — N951 Menopausal and female climacteric states: Secondary | ICD-10-CM | POA: Diagnosis not present

## 2024-02-09 DIAGNOSIS — R6882 Decreased libido: Secondary | ICD-10-CM | POA: Diagnosis not present

## 2024-02-09 DIAGNOSIS — N952 Postmenopausal atrophic vaginitis: Secondary | ICD-10-CM

## 2024-02-09 NOTE — Progress Notes (Unsigned)
 GYNECOLOGY  VISIT   HPI: 63 y.o.   Married  Caucasian female   G3P0 with Patient's last menstrual period was 01/03/2011.   here for: New GYN - Decreased libido, dryness, first noticed after hyst in 2022. Would like to discuss getting on hormones.     Having hot flashes and night sweats since she was 42.   Bloating difficulty losing weight.   Used Premarin  cream in past.  Expensive.  Cream helped to reduce her UTIs.  Hx melanoma.   Married.  3 children.  #9 grandchild on the way.  GYNECOLOGIC HISTORY: Patient's last menstrual period was 01/03/2011. Contraception:  Hyst Menopausal hormone therapy:  n/a Last 2 paps:  2021, always had abnormal pap's History of abnormal Pap or positive HPV:  yes Mammogram:  12/27/21 [facility] Cat 1 neg (Care everywhere).  She will do with her PCP in the next week or two.         OB History     Gravida  3   Para      Term      Preterm      AB      Living  3      SAB      IAB      Ectopic      Multiple      Live Births                 Patient Active Problem List   Diagnosis Date Noted   Rectocele 04/11/2018   Encounter for screening colonoscopy 04/29/2017    Past Medical History:  Diagnosis Date   Depression    Hiatal hernia    Hypothyroidism    PONV (postoperative nausea and vomiting)     Past Surgical History:  Procedure Laterality Date   ABDOMINAL HYSTERECTOMY  2021   BALLOON DILATION N/A 10/16/2022   Procedure: BALLOON DILATION;  Surgeon: Vinetta Greening, DO;  [facility];  Service: Endoscopy;  Laterality: N/A;   BIOPSY  10/16/2022   Procedure: BIOPSY;  Surgeon: Vinetta Greening, DO;  [facility];  Service: Endoscopy;;   CHOLECYSTECTOMY, LAPAROSCOPIC  08/24/1994   ESOPHAGOGASTRODUODENOSCOPY (EGD) WITH PROPOFOL  N/A 10/16/2022   Procedure: ESOPHAGOGASTRODUODENOSCOPY (EGD) WITH PROPOFOL ;  Surgeon: Vinetta Greening, DO;  [facility];  Service: Endoscopy;  Laterality: N/A;   1030am, asa 2   TUBAL LIGATION  08/24/1988    Current Outpatient Medications  Medication Sig Dispense Refill   FLUoxetine (PROZAC) 40 MG capsule Take 40 mg by mouth daily.     levothyroxine (SYNTHROID, LEVOTHROID) 100 MCG tablet Take 1 tablet by mouth daily.  0   metoprolol succinate (TOPROL-XL) 50 MG 24 hr tablet Take 25 mg by mouth. Takes one half daily     pantoprazole  (PROTONIX ) 40 MG tablet Take 1 tablet (40 mg total) by mouth 2 (two) times daily. 60 tablet 11   No current facility-administered medications for this visit.     ALLERGIES: Compazine [prochlorperazine edisylate] and Penicillins  Family History  Problem Relation Age of Onset   Alzheimer's disease Brother    Alzheimer's disease Mother    Hypothyroidism Mother    Diabetes Mellitus I Father    Stroke Father    Diabetes Mellitus II Brother    Hypothyroidism Sister    Diabetes Mellitus II Sister    Diabetes Sister    Hyperthyroidism Sister     Social History   Socioeconomic History   Marital status: Married  Spouse name: Not on file   Number of children: Not on file   Years of education: Not on file   Highest education level: Not on file  Occupational History   Not on file  Tobacco Use   Smoking status: Never    Passive exposure: Never   Smokeless tobacco: Never  Vaping Use   Vaping status: Never Used  Substance and Sexual Activity   Alcohol use: Yes    Comment: wine- occasionally   Drug use: Never   Sexual activity: Yes    Birth control/protection: Surgical  Other Topics Concern   Not on file  Social History Narrative   Not on file   Social Drivers of Health   Financial Resource Strain: Not on file  Food Insecurity: Not on file  Transportation Needs: No Transportation Needs (12/08/2019)   [facility]   PRAPARE - Transportation    Lack of Transportation (Medical): No    Lack of Transportation (Non-Medical): No  Physical Activity: Not on file  Stress: Not on file   Social Connections: Not on file  Intimate Partner Violence: Not At Risk (06/05/2020)   Received from [facility]   Humiliation, Afraid, Rape, and Kick questionnaire    Within the last year, have you been afraid of your partner or ex-partner?: No    Within the last year, have you been humiliated or emotionally abused in other ways by your partner or ex-partner?: No    Within the last year, have you been kicked, hit, slapped, or otherwise physically hurt by your partner or ex-partner?: No    Within the last year, have you been raped or forced to have any kind of sexual activity by your partner or ex-partner?: No    Review of Systems  All other systems reviewed and are negative.   PHYSICAL EXAMINATION:   BP 134/84 (BP Location: Left Arm, Patient Position: Sitting)   Pulse 77   LMP 01/03/2011   SpO2 96%     General appearance: alert, cooperative and appears stated age  ASSESSMENT:  Status post TAH in Feb 20, 2021 for recurrent abnormal paps and abnormal bleeding.   Ovaries remain.  Final pathology negative.   On Prozac started in Feb 21, 2000 since her parents died 10 days apart.  Father had stroke and passed. Mother had dementia.   Vaginal atrophy. Hypothyroidism.  Depression. On Prozac.  Hx elevated LFTs.  Fatty liver disease.   PLAN:  Testosterone , TFTsk CMP.  Brochure on menopause.  Get hysterectomy op report and pathology report.  Update mammogram.    {LABS (Optional):23779}  ***  total time was spent for this patient encounter, including preparation, face-to-face counseling with the patient, coordination of care, and documentation of the encounter.

## 2024-02-10 ENCOUNTER — Encounter: Payer: Self-pay | Admitting: Obstetrics and Gynecology

## 2024-02-10 ENCOUNTER — Ambulatory Visit: Payer: Self-pay | Admitting: Obstetrics and Gynecology

## 2024-02-10 LAB — COMPREHENSIVE METABOLIC PANEL WITH GFR
AST: 35 U/L (ref 10–35)
BUN: 17 mg/dL (ref 7–25)
Calcium: 9.7 mg/dL (ref 8.6–10.4)
Creat: 0.82 mg/dL (ref 0.50–1.05)
Potassium: 4.2 mmol/L (ref 3.5–5.3)

## 2024-02-11 NOTE — Patient Instructions (Signed)
 Menopause: What to Know Menopause is the time in your life when your menstrual periods stop. It marks the end of your ability to get pregnant. It can be defined as not having a period for 12 months without another medical cause. The time when you start to move into menopause is called perimenopause. It often happens between ages 67-55. It can last for many years. During perimenopause, hormone levels change in your body. This can cause symptoms and affect your health. Menopause may make you more likely to have: Bones that are weak and break more easily. Depression. This is when you feel sad or hopeless. Arteries that harden and get narrow. These can cause heart attacks and strokes. What are the causes? In most cases, menopause is a natural change to your body and hormone levels that happens as you get older. But in some cases, it may be caused by changes that aren't natural. These include: Surgery to take out both ovaries. Side effects from some medicines. What increases the risk? You're more likely to go through menopause early if: You have an abnormal growth (tumor) of the pituitary gland in your brain. You have a disease that affects your ovaries. You've had certain treatments for cancer. These include: Chemotherapy. Hormone therapy. Radiation therapy on the area between your hips (pelvis). You smoke a lot or drink a lot of alcohol. Other people in your family have gone through menopause early. You're very thin. What are the signs or symptoms? You may have: Hot flashes. Irregular periods. Night sweats. Changes in how you feel about sex. You may: Have less of a sex drive. Feel more discomfort around your sexuality. Vaginal dryness and thinning of the vaginal walls. This may make it hurt to have sex. Skin changes, such as: Dry skin. New wrinkles. Headaches. Other symptoms may include: Trouble sleeping. Mood swings. Memory problems. Weight gain. Hair growth on your face and  chest. Bladder infections or trouble peeing. How is this diagnosed? You may be diagnosed based on: Your medical history. An exam. Your age. Your history of menstrual periods. Your symptoms. Hormone tests. How is this treated? In some cases, no treatment is needed. Talk with your health care provider about if you should get treated. Treatments may include: Menopausal hormone therapy (MHT). Medicines to treat certain symptoms. Acupuncture. Vitamin or herbal supplements. Before you start treatment, let your provider know if you or anyone in your family has or has had: Heart disease. Breast cancer. Blood clots. Diabetes. Osteoporosis. Follow these instructions at home: Eating and drinking  Eat a balanced diet. It should include: Fresh fruits and vegetables. Whole grains. Lean protein. Low-fat dairy. Eat lots of foods that have calcium and vitamin D in them. These can help keep your bones healthy. Foods and drinks that are rich in calcium include: Yogurt and low-fat milk. Beans. Almonds. Sardines. Broccoli and kale. To help prevent hot flashes, stay away from: Alcohol. Drinks with caffeine in them. Spicy foods. Lifestyle Do not smoke, vape, or use nicotine or tobacco. Get 7-8 hours of sleep each night. If you have hot flashes, you may want to: Dress in layers. Avoid things that may trigger hot flashes, like warm places or stress. Take slow, deep breaths when a hot flash starts. Keep a fan in your home and office. Find ways to manage stress. You may want to try: Deep breathing. Meditation. Writing in a journal. Ask your provider about going to group therapy. Therapy can help you get support from others who are going  through menopause. General instructions  Talk with your provider before you take any herbal supplements. Keep track of your symptoms. Track: When they start. How often you have them. How long they last. Use vaginal lubricants or moisturizers. These  can help with: Vaginal dryness. Comfort during sex. Contact a health care provider if: You're older than 55 and still get periods. You have pain during sex. You haven't had a period for 12 months and then start to bleed from your vagina. It hurts to pee. You get very bad headaches. Get help right away if: You're very depressed. You have a lot of bleeding from your vagina. Your heart is beating too fast. You have very bad belly pain or indigestion that doesn't go away with medicines. This information is not intended to replace advice given to you by your health care provider. Make sure you discuss any questions you have with your health care provider. Document Revised: 04/15/2023 Document Reviewed: 04/15/2023 Elsevier Patient Education  2024 [employer].

## 2024-02-14 LAB — COMPREHENSIVE METABOLIC PANEL WITH GFR
AG Ratio: 1.5 (calc) (ref 1.0–2.5)
ALT: 36 U/L — ABNORMAL HIGH (ref 6–29)
Albumin: 4.5 g/dL (ref 3.6–5.1)
Alkaline phosphatase (APISO): 70 U/L (ref 37–153)
CO2: 25 mmol/L (ref 20–32)
Chloride: 105 mmol/L (ref 98–110)
Globulin: 3 g/dL (ref 1.9–3.7)
Glucose, Bld: 84 mg/dL (ref 65–99)
Sodium: 139 mmol/L (ref 135–146)
Total Bilirubin: 0.9 mg/dL (ref 0.2–1.2)
Total Protein: 7.5 g/dL (ref 6.1–8.1)
eGFR: 81 mL/min/{1.73_m2} (ref >=60)

## 2024-02-14 LAB — TESTOS,TOTAL,FREE AND SHBG (FEMALE)
Free Testosterone: 3.2 pg/mL (ref 0.1–6.4)
Sex Hormone Binding: 30 nmol/L (ref 14–73)
Testosterone, Total, LC-MS-MS: 25 ng/dL (ref 2–45)

## 2024-02-14 LAB — TSH: TSH: 2.74 m[IU]/L (ref 0.40–4.50)

## 2024-02-14 LAB — T4, FREE: Free T4: 1.1 ng/dL (ref 0.8–1.8)

## 2024-03-02 ENCOUNTER — Encounter: Payer: Self-pay | Admitting: Obstetrics and Gynecology

## 2024-03-02 ENCOUNTER — Ambulatory Visit: Admitting: Obstetrics and Gynecology

## 2024-03-02 NOTE — Progress Notes (Deleted)
 63 y.o. G72P0 Married Caucasian female here for annual exam.    PCP: Atilano Deward ORN, MD   Patient's last menstrual period was 01/03/2011.           Sexually active: Yes.    The current method of family planning is status post hysterectomy.    Menopausal hormone therapy:  n/a Exercising: {yes no:314532}  {types:19826} Smoker:  no  OB History  Gravida Para Term Preterm AB Living  3     3  SAB IAB Ectopic Multiple Live Births          # Outcome Date GA Lbr Len/2nd Weight Sex Type Anes PTL Lv  3 Gravida           2 Gravida           1 Gravida              HEALTH MAINTENANCE: Last 2 paps:  2021, normal  History of abnormal Pap or positive HPV:  no Mammogram:   12/27/21 [facility] Cat 1 neg(Care everywhere) Colonoscopy:  10/16/22  Bone Density:  n/a  Result  n/a   Immunization History  Administered Date(s) Administered   Moderna Sars-Covid-2 Vaccination 12/08/2019, 01/12/2020      reports that she has never smoked. She has never been exposed to tobacco smoke. She has never used smokeless tobacco. She reports current alcohol use. She reports that she does not use drugs.  Past Medical History:  Diagnosis Date   Depression    Elevated ALT measurement    Hiatal hernia    Hypothyroidism    PONV (postoperative nausea and vomiting)     Past Surgical History:  Procedure Laterality Date   ABDOMINAL HYSTERECTOMY  2021   BALLOON DILATION N/A 10/16/2022   Procedure: BALLOON DILATION;  Surgeon: Cindie Carlin POUR, DO;  Location: [facility];  Service: Endoscopy;  Laterality: N/A;   BIOPSY  10/16/2022   Procedure: BIOPSY;  Surgeon: Cindie Carlin POUR, DO;  Location: [facility];  Service: Endoscopy;;   CHOLECYSTECTOMY, LAPAROSCOPIC  08/24/1994   ESOPHAGOGASTRODUODENOSCOPY (EGD) WITH PROPOFOL  N/A 10/16/2022   Procedure: ESOPHAGOGASTRODUODENOSCOPY (EGD) WITH PROPOFOL ;  Surgeon: Cindie Carlin POUR, DO;  Location: [facility];  Service: Endoscopy;  Laterality: N/A;  1030am, asa 2    TUBAL LIGATION  08/24/1988    Current Outpatient Medications  Medication Sig Dispense Refill   FLUoxetine (PROZAC) 40 MG capsule Take 40 mg by mouth daily.     levothyroxine (SYNTHROID, LEVOTHROID) 100 MCG tablet Take 1 tablet by mouth daily.  0   metoprolol succinate (TOPROL-XL) 50 MG 24 hr tablet Take 25 mg by mouth. Takes one half daily     pantoprazole  (PROTONIX ) 40 MG tablet Take 1 tablet (40 mg total) by mouth 2 (two) times daily. 60 tablet 11   No current facility-administered medications for this visit.    ALLERGIES: Compazine [prochlorperazine edisylate] and Penicillins  Family History  Problem Relation Age of Onset   Alzheimer's disease Brother    Alzheimer's disease Mother    Hypothyroidism Mother    Diabetes Mellitus I Father    Stroke Father    Diabetes Mellitus II Brother    Hypothyroidism Sister    Diabetes Mellitus II Sister    Diabetes Sister    Hyperthyroidism Sister     Review of Systems  PHYSICAL EXAM:  LMP 01/03/2011     General appearance: alert, cooperative and appears stated age Head: normocephalic, without obvious abnormality, atraumatic Neck: no adenopathy, supple,  symmetrical, trachea midline and thyroid  normal to inspection and palpation Lungs: clear to auscultation bilaterally Breasts: normal appearance, no masses or tenderness, No nipple retraction or dimpling, No nipple discharge or bleeding, No axillary adenopathy Heart: regular rate and rhythm Abdomen: soft, non-tender; no masses, no organomegaly Extremities: extremities normal, atraumatic, no cyanosis or edema Skin: skin color, texture, turgor normal. No rashes or lesions Lymph nodes: cervical, supraclavicular, and axillary nodes normal. Neurologic: grossly normal  Pelvic: External genitalia:  no lesions              No abnormal inguinal nodes palpated.              Urethra:  normal appearing urethra with no masses, tenderness or lesions              Bartholins and Skenes: normal                  Vagina: normal appearing vagina with normal color and discharge, no lesions              Cervix: no lesions              Pap taken: {yes no:314532} Bimanual Exam:  Uterus:  normal size, contour, position, consistency, mobility, non-tender              Adnexa: no mass, fullness, tenderness              Rectal exam: {yes no:314532}.  Confirms.              Anus:  normal sphincter tone, no lesions  Chaperone was present for exam:  {BSCHAPERONE:31226::Emily F, CMA}  ASSESSMENT: Well woman visit with gynecologic exam.  PHQ-2-9: ***  ***  PLAN: Mammogram screening discussed. Self breast awareness reviewed. Pap and HRV collected:  {yes no:314532} Guidelines for Calcium, Vitamin D, regular exercise program including cardiovascular and weight bearing exercise. Medication refills:  *** {LABS (Optional):23779} Follow up:  ***    Additional counseling given.  {yes [id number]. ***  total time was spent for this patient encounter, including preparation, face-to-face counseling with the patient, coordination of care, and documentation of the encounter in addition to doing the well woman visit with gynecologic exam.

## 2024-11-16 ENCOUNTER — Ambulatory Visit: Admitting: Obstetrics and Gynecology
